# Patient Record
Sex: Male | Born: 1980 | Race: Black or African American | Hispanic: No | Marital: Single | Smoking: Current every day smoker
Health system: Southern US, Community
[De-identification: ages and names within clinical notes are randomized; demographics above are authoritative.]

## PROBLEM LIST (undated history)

## (undated) DIAGNOSIS — R7989 Other specified abnormal findings of blood chemistry: Secondary | ICD-10-CM

## (undated) DIAGNOSIS — G629 Polyneuropathy, unspecified: Secondary | ICD-10-CM

## (undated) DIAGNOSIS — F1010 Alcohol abuse, uncomplicated: Secondary | ICD-10-CM

## (undated) HISTORY — PX: HAND SURGERY: SHX662

## (undated) HISTORY — PX: MANDIBLE SURGERY: SHX707

---

## 2012-07-16 ENCOUNTER — Emergency Department (HOSPITAL_BASED_OUTPATIENT_CLINIC_OR_DEPARTMENT_OTHER): Payer: Self-pay

## 2012-07-16 ENCOUNTER — Emergency Department (HOSPITAL_BASED_OUTPATIENT_CLINIC_OR_DEPARTMENT_OTHER)
Admission: EM | Admit: 2012-07-16 | Discharge: 2012-07-16 | Disposition: A | Discharge status: Home / Self Care | Payer: Self-pay | Attending: Emergency Medicine | Admitting: Emergency Medicine

## 2012-07-16 ENCOUNTER — Encounter (HOSPITAL_BASED_OUTPATIENT_CLINIC_OR_DEPARTMENT_OTHER): Payer: Self-pay | Admitting: Student

## 2012-07-16 DIAGNOSIS — R197 Diarrhea, unspecified: Secondary | ICD-10-CM | POA: Insufficient documentation

## 2012-07-16 DIAGNOSIS — F17200 Nicotine dependence, unspecified, uncomplicated: Secondary | ICD-10-CM | POA: Insufficient documentation

## 2012-07-16 DIAGNOSIS — Z79899 Other long term (current) drug therapy: Secondary | ICD-10-CM | POA: Insufficient documentation

## 2012-07-16 DIAGNOSIS — Z880 Allergy status to penicillin: Secondary | ICD-10-CM | POA: Insufficient documentation

## 2012-07-16 DIAGNOSIS — R1013 Epigastric pain: Secondary | ICD-10-CM | POA: Insufficient documentation

## 2012-07-16 DIAGNOSIS — R112 Nausea with vomiting, unspecified: Secondary | ICD-10-CM | POA: Insufficient documentation

## 2012-07-16 LAB — CBC WITH DIFFERENTIAL/PLATELET
Basophils Relative: 1 % (ref 0–1)
HCT: 44.1 % (ref 39.0–52.0)
Hemoglobin: 15.9 g/dL (ref 13.0–17.0)
Lymphocytes Relative: 46 % (ref 12–46)
Lymphs Abs: 2.1 10*3/uL (ref 0.7–4.0)
MCHC: 36.1 g/dL — ABNORMAL HIGH (ref 30.0–36.0)
Monocytes Absolute: 0.5 10*3/uL (ref 0.1–1.0)
Monocytes Relative: 10 % (ref 3–12)
Neutro Abs: 1.6 10*3/uL — ABNORMAL LOW (ref 1.7–7.7)
Neutrophils Relative %: 35 % — ABNORMAL LOW (ref 43–77)
RBC: 4.9 MIL/uL (ref 4.22–5.81)

## 2012-07-16 LAB — URINALYSIS, ROUTINE W REFLEX MICROSCOPIC
Hgb urine dipstick: NEGATIVE
Ketones, ur: 40 mg/dL — AB
Nitrite: NEGATIVE
Specific Gravity, Urine: 1.023 (ref 1.005–1.030)
pH: 6 (ref 5.0–8.0)

## 2012-07-16 LAB — COMPREHENSIVE METABOLIC PANEL
Albumin: 5 g/dL (ref 3.5–5.2)
Alkaline Phosphatase: 74 U/L (ref 39–117)
BUN: 13 mg/dL (ref 6–23)
CO2: 24 mEq/L (ref 19–32)
Chloride: 97 mEq/L (ref 96–112)
Creatinine, Ser: 0.8 mg/dL (ref 0.50–1.35)
GFR calc non Af Amer: >90 mL/min (ref >90)
Glucose, Bld: 90 mg/dL (ref 70–99)
Potassium: 4.6 mEq/L (ref 3.5–5.1)
Total Bilirubin: 1 mg/dL (ref 0.3–1.2)

## 2012-07-16 LAB — URINE MICROSCOPIC-ADD ON

## 2012-07-16 LAB — LIPASE, BLOOD: Lipase: 32 U/L (ref 11–59)

## 2012-07-16 MED ORDER — OMEPRAZOLE 20 MG PO CPDR: 20.0000 mg | DELAYED_RELEASE_CAPSULE | Freq: Every day | ORAL | Status: DC

## 2012-07-16 MED ORDER — MORPHINE SULFATE 4 MG/ML IJ SOLN
4.0000 mg | Freq: Once | INTRAMUSCULAR | Status: AC
  Administered 2012-07-16: 4 mg via INTRAVENOUS
  Filled 2012-07-16: qty 1

## 2012-07-16 MED ORDER — ONDANSETRON HCL 4 MG/2ML IJ SOLN
4.0000 mg | Freq: Once | INTRAMUSCULAR | Status: AC
  Administered 2012-07-16: 4 mg via INTRAVENOUS
  Filled 2012-07-16: qty 2

## 2012-07-16 MED ORDER — IOHEXOL 300 MG/ML  SOLN
50.0000 mL | Freq: Once | INTRAMUSCULAR | Status: AC | PRN
  Administered 2012-07-16: 50 mL via ORAL

## 2012-07-16 MED ORDER — SODIUM CHLORIDE 0.9 % IV BOLUS (SEPSIS)
1000.0000 mL | Freq: Once | INTRAVENOUS | Status: AC
  Administered 2012-07-16: 1000 mL via INTRAVENOUS

## 2012-07-16 MED ORDER — SUCRALFATE 1 G PO TABS: 1.0000 g | ORAL_TABLET | Freq: Four times a day (QID) | ORAL | Status: DC

## 2012-07-16 MED ORDER — IOHEXOL 300 MG/ML  SOLN
100.0000 mL | Freq: Once | INTRAMUSCULAR | Status: AC | PRN
  Administered 2012-07-16: 100 mL via INTRAVENOUS

## 2012-07-16 NOTE — ED Notes (Signed)
IV removed and both sites applied gauzes and tape

## 2012-07-16 NOTE — ED Notes (Signed)
Pt in with c/o abdominal pain in middle of stomach x 1 week with associated N after eating.

## 2012-07-16 NOTE — ED Provider Notes (Signed)
History     CSN: [id number]  Arrival date & time 07/16/12  1100   First MD Initiated Contact with Patient 07/16/12 1221      Chief Complaint  Patient presents with  . Abdominal Pain    (Consider location/radiation/quality/duration/timing/severity/associated sxs/prior treatment) HPI Comments: Patient complains of abdominal pain. He states it's been gone on for a week but has been gradually getting worse. He has a discomfort he says to his midabdomen. He says gets worse after he eats and he has diarrhea and loose stools after he. He's had associated nausea and vomiting. His emesis has been nonbloody and nonbilious. He denies any blood in the stool or melena. He does admit to drinking alcohol he says he drinks at least 3 or 4 beers a night.  Patient is a 32 y.o. male presenting with abdominal pain.  Abdominal Pain Associated symptoms include abdominal pain. Pertinent negatives include no chest pain, no headaches and no shortness of breath.    History reviewed. No pertinent past medical history.  Past Surgical History  Procedure Laterality Date  . Mandible surgery Right   . Hand surgery Right     History reviewed. No pertinent family history.  History  Substance Use Topics  . Smoking status: Current Every Day Smoker  . Smokeless tobacco: Not on file  . Alcohol Use: Yes      Review of Systems  Constitutional: Negative for fever, chills, diaphoresis and fatigue.  HENT: Negative for congestion, rhinorrhea and sneezing.   Eyes: Negative.   Respiratory: Negative for cough, chest tightness and shortness of breath.   Cardiovascular: Negative for chest pain and leg swelling.  Gastrointestinal: Positive for nausea, vomiting, abdominal pain and diarrhea. Negative for blood in stool.  Genitourinary: Negative for frequency, hematuria, flank pain and difficulty urinating.  Musculoskeletal: Negative for back pain and arthralgias.  Skin: Negative for rash.  Neurological: Negative for  dizziness, speech difficulty, weakness, numbness and headaches.    Allergies  Penicillins  Home Medications   Current Outpatient Rx  Name  Route  Sig  Dispense  Refill  . omeprazole (PRILOSEC) 20 MG capsule   Oral   Take 1 capsule (20 mg total) by mouth daily.   30 capsule   0   . sucralfate (CARAFATE) 1 G tablet   Oral   Take 1 tablet (1 g total) by mouth 4 (four) times daily.   30 tablet   0     BP 125/86  Pulse 92  Temp(Src) 99 F (37.2 C) (Oral)  Resp 18  Ht 6\' 1"  (1.854 m)  Wt 160 lb (72.576 kg)  BMI 21.11 kg/m2  SpO2 99%  Physical Exam  Constitutional: He is oriented to person, place, and time. He appears well-developed and well-nourished.  HENT:  Head: Normocephalic and atraumatic.  Eyes: Pupils are equal, round, and reactive to light.  Neck: Normal range of motion. Neck supple.  Cardiovascular: Normal rate, regular rhythm and normal heart sounds.   Pulmonary/Chest: Effort normal and breath sounds normal. No respiratory distress. He has no wheezes. He has no rales. He exhibits no tenderness.  Abdominal: Soft. Bowel sounds are normal. There is tenderness (mild TTP epigastrium, moderate TTP RLQ, no CVA tenderness). There is no rebound and no guarding.  Musculoskeletal: Normal range of motion. He exhibits no edema.  Lymphadenopathy:    He has no cervical adenopathy.  Neurological: He is alert and oriented to person, place, and time.  Skin: Skin is warm and dry. No rash noted.  Psychiatric: He has a normal mood and affect.    ED Course  Procedures (including critical care time)  Results for orders placed during the hospital encounter of 07/16/12  URINALYSIS, ROUTINE W REFLEX MICROSCOPIC      Result Value Range   Color, Urine AMBER (*) YELLOW   APPearance CLEAR  CLEAR   Specific Gravity, Urine 1.023  1.005 - 1.030   pH 6.0  5.0 - 8.0   Glucose, UA NEGATIVE  NEGATIVE mg/dL   Hgb urine dipstick NEGATIVE  NEGATIVE   Bilirubin Urine SMALL (*) NEGATIVE    Ketones, ur 40 (*) NEGATIVE mg/dL   Protein, ur 30 (*) NEGATIVE mg/dL   Urobilinogen, UA 1.0  0.0 - 1.0 mg/dL   Nitrite NEGATIVE  NEGATIVE   Leukocytes, UA NEGATIVE  NEGATIVE  URINE MICROSCOPIC-ADD ON      Result Value Range   Squamous Epithelial / LPF RARE  RARE   RBC / HPF 0-2  <3 RBC/hpf   Urine-Other MUCOUS PRESENT    CBC WITH DIFFERENTIAL      Result Value Range   WBC 4.5  4.0 - 10.5 K/uL   RBC 4.90  4.22 - 5.81 MIL/uL   Hemoglobin 15.9  13.0 - 17.0 g/dL   HCT 19.1  47.8 - 29.5 %   MCV 90.0  78.0 - 100.0 fL   MCH 32.4  26.0 - 34.0 pg   MCHC 36.1 (*) 30.0 - 36.0 g/dL   RDW 62.1  30.8 - 65.7 %   Platelets 167  150 - 400 K/uL   Neutrophils Relative % 35 (*) 43 - 77 %   Neutro Abs 1.6 (*) 1.7 - 7.7 K/uL   Lymphocytes Relative 46  12 - 46 %   Lymphs Abs 2.1  0.7 - 4.0 K/uL   Monocytes Relative 10  3 - 12 %   Monocytes Absolute 0.5  0.1 - 1.0 K/uL   Eosinophils Relative 8 (*) 0 - 5 %   Eosinophils Absolute 0.3  0.0 - 0.7 K/uL   Basophils Relative 1  0 - 1 %   Basophils Absolute 0.0  0.0 - 0.1 K/uL  COMPREHENSIVE METABOLIC PANEL      Result Value Range   Sodium 137  135 - 145 mEq/L   Potassium 4.6  3.5 - 5.1 mEq/L   Chloride 97  96 - 112 mEq/L   CO2 24  19 - 32 mEq/L   Glucose, Bld 90  70 - 99 mg/dL   BUN 13  6 - 23 mg/dL   Creatinine, Ser 8.46  0.50 - 1.35 mg/dL   Calcium 96.2  8.4 - 95.2 mg/dL   Total Protein 8.4 (*) 6.0 - 8.3 g/dL   Albumin 5.0  3.5 - 5.2 g/dL   AST 841 (*) 0 - 37 U/L   ALT 138 (*) 0 - 53 U/L   Alkaline Phosphatase 74  39 - 117 U/L   Total Bilirubin 1.0  0.3 - 1.2 mg/dL   GFR calc non Af Amer >90  >90 mL/min   GFR calc Af Amer >90  >90 mL/min  LIPASE, BLOOD      Result Value Range   Lipase 32  11 - 59 U/L   Ct Abdomen Pelvis W Contrast  07/16/2012   *RADIOLOGY REPORT*  Clinical Data: RLQ pain, nausea, diarrhea  CT ABDOMEN AND PELVIS WITH CONTRAST  Technique:  Multidetector CT imaging of the abdomen and pelvis was performed following the standard  protocol during bolus  administration of intravenous contrast.  Contrast: OMNIPAQUE IOHEXOL 300 MG/ML  SOLN  Comparison: None.  Findings: Lung bases are clear.  Focal fat/altered perfusion along the falciform ileum (series 2/image 27).  7 mm probable flash filling hemangioma in the lateral segment left hepatic lobe (series 2/image 30).  Spleen, pancreas, and adrenal glands are within normal limits.  Gallbladder is unremarkable.  No intrahepatic or extrahepatic ductal dilatation.  Kidneys are within normal limits.  No hydronephrosis.  No evidence of bowel obstruction.  Normal appendix.  Mildly thickened appearance of the cecum/ileocecal valve is likely related to underdistension.  No evidence of abdominal aortic aneurysm.  No abdominopelvic ascites.  No suspicious abdominopelvic lymphadenopathy.  Prostate is unremarkable.  Bladder is within normal limits.  Visualized osseous structures are within normal limits.  IMPRESSION: Normal appendix.  No evidence of bowel obstruction.  No CT findings to account for the patient's right lower quadrant abdominal pain.   Original Report Authenticated By: Charline Bills, M.D.       1. Epigastric pain       MDM  Patient with abdominal pain. He has primarily pain in his epigastrium although on exam he was having some tenderness to his right lower quadrant. There is no evidence of appendicitis. I feel this is likely gastritis. There is no evidence of pancreatitis or gallbladder disease. He is a chronic drinker and I did counsel him that he needs to cut back or stop his drinking. I did notify him that his liver enzymes are elevated which is likely related to the alcohol use. I will give him for surgery for Prilosec and Carafate. I advised him to followup with gastroenterology. Advised to return here if his symptoms worsen.        Rolan Bucco, MD 07/16/12 [phone]

## 2012-07-18 ENCOUNTER — Telehealth (HOSPITAL_BASED_OUTPATIENT_CLINIC_OR_DEPARTMENT_OTHER): Payer: Self-pay | Admitting: Emergency Medicine

## 2012-07-18 MED ORDER — OMEPRAZOLE 20 MG PO CPDR: 20.0000 mg | DELAYED_RELEASE_CAPSULE | Freq: Every day | ORAL | Status: DC

## 2012-07-18 MED ORDER — SUCRALFATE 1 G PO TABS: 1.0000 g | ORAL_TABLET | Freq: Four times a day (QID) | ORAL | Status: DC

## 2012-07-18 NOTE — ED Notes (Signed)
Pt called stating that he never received his prescriptions.  Pt's prescriptions must be in the [facility].  These medications have been electronically sent to the [facility] on [address] in [facility].

## 2014-02-18 ENCOUNTER — Encounter (HOSPITAL_BASED_OUTPATIENT_CLINIC_OR_DEPARTMENT_OTHER): Payer: Self-pay

## 2014-02-18 ENCOUNTER — Emergency Department (HOSPITAL_BASED_OUTPATIENT_CLINIC_OR_DEPARTMENT_OTHER)
Admission: EM | Admit: 2014-02-18 | Discharge: 2014-02-18 | Disposition: A | Discharge status: Home / Self Care | Payer: Self-pay | Attending: Emergency Medicine | Admitting: Emergency Medicine

## 2014-02-18 DIAGNOSIS — M545 Low back pain, unspecified: Secondary | ICD-10-CM

## 2014-02-18 DIAGNOSIS — Z880 Allergy status to penicillin: Secondary | ICD-10-CM | POA: Insufficient documentation

## 2014-02-18 DIAGNOSIS — Z720 Tobacco use: Secondary | ICD-10-CM | POA: Insufficient documentation

## 2014-02-18 MED ORDER — TRAMADOL HCL 50 MG PO TABS: 50.0000 mg | ORAL_TABLET | Freq: Four times a day (QID) | ORAL | Status: DC | PRN

## 2014-02-18 MED ORDER — NAPROXEN 500 MG PO TABS: 500.0000 mg | ORAL_TABLET | Freq: Two times a day (BID) | ORAL | Status: DC

## 2014-02-18 NOTE — Discharge Instructions (Signed)

## 2014-02-18 NOTE — ED Notes (Signed)
Patient here with lower back pain x 2 days, denies any known injury. Reports pain with any movment

## 2014-02-18 NOTE — ED Provider Notes (Signed)
CSN: [id number]     Arrival date & time 02/18/14  0754 History   First MD Initiated Contact with Patient 02/18/14 [phone]     Chief Complaint  Patient presents with  . Back Pain     (Consider location/radiation/quality/duration/timing/severity/associated sxs/prior Treatment) HPI Comments: Patient presents with low back pain. He describes an achy pain to his low back for the last 2 days. It's been worsening for last 2 days. It's worse with movement or standing. He denies any past history of back problems. 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 He denies any radiation of the pain. He denies any numbness or weakness to his legs. He denies any urinary symptoms. He denies any loss of bowel or bladder control. He denies any history of cancer. He's been taking ibuprofen without relief.  Patient is a 34 y.o. Luis [name] presenting with back pain.  Back Pain Associated symptoms: no abdominal pain, no dysuria, no fever, no headaches, no numbness and no weakness     History reviewed. No pertinent past medical history. Past Surgical History  Procedure Laterality Date  . Mandible surgery Right   . Hand surgery Right    No family history on file. History  Substance Use Topics  . Smoking status: Current Every Day Smoker  . Smokeless tobacco: Not on file  . Alcohol Use: Yes    Review of Systems  Constitutional: Negative for fever.  Gastrointestinal: Negative for nausea, vomiting and abdominal pain.  Genitourinary: Negative.  Negative for dysuria, frequency, hematuria, decreased urine volume and testicular pain.  Musculoskeletal: Positive for back pain. Negative for joint swelling, arthralgias and neck pain.  Skin: Negative for wound.  Neurological: Negative for weakness, numbness and headaches.      Allergies  Penicillins  Home Medications   Prior to Admission medications   Medication Sig Start Date End Date Taking? Authorizing Provider  naproxen (NAPROSYN) 500 MG  tablet Take 1 tablet (500 mg total) by mouth 2 (two) times daily. 02/18/14   Rolan Bucco, MD  traMADol (ULTRAM) 50 MG tablet Take 1 tablet (50 mg total) by mouth every 6 (six) hours as needed. 02/18/14   Rolan Bucco, MD   BP 139/93 mmHg  Pulse 101  Temp(Src) 98.3 F (36.8 C) (Oral)  Resp 14  Ht  (1.854 m)  Wt 164 lb (74.39 kg)  BMI 21.64 kg/m2  SpO2 100% Physical Exam  Constitutional: He is oriented to person, place, and time. He appears well-developed and well-nourished.  Cardiovascular: Normal rate.   HR 92 on my exam  Pulmonary/Chest: Effort normal.  Musculoskeletal:  Positive tenderness along the lower lumbar spine. No step-offs or deformities. There some tenderness along the paraspinal muscles bilaterally. Negative straight leg raise bilaterally. Patellar reflexes symmetric bilaterally.  Neurological: He is alert and oriented to person, place, and time.  Patient has normal motor function in the legs bilaterally. He has normal sensation in the legs bilaterally. Gait is normal. Pedal pulses intact    ED Course  Procedures (including critical care time) Labs Review Labs Reviewed - No data to display  Imaging Review No results found.   EKG Interpretation None      MDM   Final diagnoses:  Midline low back pain without sciatica    Patient presents with low back pain. He is neurologically intact. There is no evidence of cauda equina. He has no other red flags which would warrant imaging studies today. He was given prescription for Ultram  and Naprosyn. He was given referral to follow-up with Dr. Pearletha ForgeHudnall if his symptoms are not improving.    Rolan BuccoMelanie Jahni Nazar, MD 02/18/14 [phone]

## 2014-04-26 ENCOUNTER — Encounter (HOSPITAL_BASED_OUTPATIENT_CLINIC_OR_DEPARTMENT_OTHER): Payer: Self-pay

## 2014-04-26 ENCOUNTER — Emergency Department (HOSPITAL_BASED_OUTPATIENT_CLINIC_OR_DEPARTMENT_OTHER): Payer: Self-pay

## 2014-04-26 ENCOUNTER — Emergency Department (HOSPITAL_BASED_OUTPATIENT_CLINIC_OR_DEPARTMENT_OTHER)
Admission: EM | Admit: 2014-04-26 | Discharge: 2014-04-26 | Disposition: A | Discharge status: Home / Self Care | Payer: Self-pay | Attending: Emergency Medicine | Admitting: Emergency Medicine

## 2014-04-26 DIAGNOSIS — Z720 Tobacco use: Secondary | ICD-10-CM | POA: Insufficient documentation

## 2014-04-26 DIAGNOSIS — Z880 Allergy status to penicillin: Secondary | ICD-10-CM | POA: Insufficient documentation

## 2014-04-26 DIAGNOSIS — Z791 Long term (current) use of non-steroidal anti-inflammatories (NSAID): Secondary | ICD-10-CM | POA: Insufficient documentation

## 2014-04-26 DIAGNOSIS — R079 Chest pain, unspecified: Secondary | ICD-10-CM | POA: Insufficient documentation

## 2014-04-26 LAB — URINALYSIS, ROUTINE W REFLEX MICROSCOPIC
Bilirubin Urine: NEGATIVE
GLUCOSE, UA: NEGATIVE mg/dL
HGB URINE DIPSTICK: NEGATIVE
KETONES UR: NEGATIVE mg/dL
Leukocytes, UA: NEGATIVE
Nitrite: NEGATIVE
PROTEIN: NEGATIVE mg/dL
Specific Gravity, Urine: 1.021 (ref 1.005–1.030)
Urobilinogen, UA: 1 mg/dL (ref 0.0–1.0)
pH: 7 (ref 5.0–8.0)

## 2014-04-26 LAB — CBC WITH DIFFERENTIAL/PLATELET
BAND NEUTROPHILS: 0 % (ref 0–10)
BLASTS: 0 %
Basophils Absolute: 0 10*3/uL (ref 0.0–0.1)
Basophils Relative: 0 % (ref 0–1)
Eosinophils Absolute: 0.4 10*3/uL (ref 0.0–0.7)
Eosinophils Relative: 9 % — ABNORMAL HIGH (ref 0–5)
HCT: 39.6 % (ref 39.0–52.0)
Hemoglobin: 14 g/dL (ref 13.0–17.0)
LYMPHS ABS: 2.2 10*3/uL (ref 0.7–4.0)
LYMPHS PCT: 57 % — AB (ref 12–46)
MCH: 32.3 pg (ref 26.0–34.0)
MCHC: 35.4 g/dL (ref 30.0–36.0)
MCV: 91.5 fL (ref 78.0–100.0)
MONO ABS: 0.4 10*3/uL (ref 0.1–1.0)
Metamyelocytes Relative: 0 %
Monocytes Relative: 11 % (ref 3–12)
Myelocytes: 0 %
NEUTROS ABS: 0.9 10*3/uL — AB (ref 1.7–7.7)
NEUTROS PCT: 23 % — AB (ref 43–77)
PLATELETS: 177 10*3/uL (ref 150–400)
PROMYELOCYTES ABS: 0 %
RBC: 4.33 MIL/uL (ref 4.22–5.81)
RDW: 12 % (ref 11.5–15.5)
WBC: 3.9 10*3/uL — AB (ref 4.0–10.5)
nRBC: 0 /100 WBC

## 2014-04-26 LAB — BASIC METABOLIC PANEL
ANION GAP: 9 (ref 5–15)
BUN: 12 mg/dL (ref 6–23)
CHLORIDE: 102 mmol/L (ref 96–112)
CO2: 27 mmol/L (ref 19–32)
CREATININE: 0.84 mg/dL (ref 0.50–1.35)
Calcium: 9.3 mg/dL (ref 8.4–10.5)
GFR calc Af Amer: >90 mL/min (ref >90)
GFR calc non Af Amer: >90 mL/min (ref >90)
Glucose, Bld: 104 mg/dL — ABNORMAL HIGH (ref 70–99)
Potassium: 4 mmol/L (ref 3.5–5.1)
Sodium: 138 mmol/L (ref 135–145)

## 2014-04-26 LAB — D-DIMER, QUANTITATIVE: D-Dimer, Quant: <0.27 ug/mL-FEU (ref 0.00–0.48)

## 2014-04-26 LAB — TROPONIN I: Troponin I: <0.03 ng/mL (ref <0.031)

## 2014-04-26 MED ORDER — HYDROCODONE-ACETAMINOPHEN 5-325 MG PO TABS: 1.0000 | ORAL_TABLET | ORAL | Status: DC | PRN

## 2014-04-26 MED ORDER — IBUPROFEN 800 MG PO TABS: 800.0000 mg | ORAL_TABLET | Freq: Three times a day (TID) | ORAL | Status: DC | PRN

## 2014-04-26 MED ORDER — KETOROLAC TROMETHAMINE 30 MG/ML IJ SOLN
30.0000 mg | Freq: Once | INTRAMUSCULAR | Status: AC
  Administered 2014-04-26: 30 mg via INTRAVENOUS
  Filled 2014-04-26: qty 1

## 2014-04-26 NOTE — ED Provider Notes (Signed)
TIME SEEN: 11:30 AM  CHIEF COMPLAINT: Chest pain, shortness of breath  HPI: Pt is a 34 y.o. male with history of tobacco use, prior spontaneous pneumothorax in January 2015 at [facility] that did not require a chest tube presents emergency department with intermittent sharp chest pain that started on the left side but is now diffusely across his chest. He has had some associated shortness of breath because it hurts to take a deep breath and dry cough. No fever or chills. No dizziness, diaphoresis, nausea or vomiting. No history of hypertension, diabetes, hyperlipidemia. No prior history of pulmonary embolus or DVT.  No recent surgery, fracture, trauma. No family history of premature CAD.  ROS: See HPI Constitutional: no fever  Eyes: no drainage  ENT: no runny nose   Cardiovascular:   chest pain  Resp:  SOB  GI: no vomiting GU: no dysuria Integumentary: no rash  Allergy: no hives  Musculoskeletal: no leg swelling  Neurological: no slurred speech ROS otherwise negative  PAST MEDICAL HISTORY/PAST SURGICAL HISTORY:  No past medical history on file.  MEDICATIONS:  Prior to Admission medications   Medication Sig Start Date End Date Taking? Authorizing Provider  naproxen (NAPROSYN) 500 MG tablet Take 1 tablet (500 mg total) by mouth 2 (two) times daily. 02/18/14   Rolan Bucco, MD  traMADol (ULTRAM) 50 MG tablet Take 1 tablet (50 mg total) by mouth every 6 (six) hours as needed. 02/18/14   Rolan Bucco, MD    ALLERGIES:  Allergies  Allergen Reactions  . Penicillins     SOCIAL HISTORY:  History  Substance Use Topics  . Smoking status: Current Every Day Smoker -- 0.50 packs/day for 15 years    Types: Cigarettes  . Smokeless tobacco: Never Used  . Alcohol Use: Yes    FAMILY HISTORY: No family history on file.  EXAM: BP 138/94 mmHg  Pulse 84  Temp(Src) 98.1 F (36.7 C)  Resp 18  Ht  (1.854 m)  Wt 164 lb (74.39 kg)  BMI 21.64 kg/m2  SpO2  100% CONSTITUTIONAL: Alert and oriented and responds appropriately to questions. Well-appearing; well-nourished HEAD: Normocephalic EYES: Conjunctivae clear, PERRL ENT: normal nose; no rhinorrhea; moist mucous membranes; pharynx without lesions noted NECK: Supple, no meningismus, no LAD  CARD: RRR; S1 and S2 appreciated; no murmurs, no clicks, no rubs, no gallops RESP: Normal chest excursion without splinting or tachypnea; breath sounds clear and equal bilaterally; no wheezes, no rhonchi, no rales, chest wall is mildly tender to palpation without crepitus or ecchymosis or deformity, good aeration diffusely, no respiratory distress or hypoxia, speaking full sentences ABD/GI: Normal bowel sounds; non-distended; soft, non-tender, no rebound, no guarding BACK:  The back appears normal and is non-tender to palpation, there is no CVA tenderness EXT: Normal ROM in all joints; non-tender to palpation; no edema; normal capillary refill; no cyanosis; no calf tenderness or swelling    SKIN: Normal color for age and race; warm NEURO: Moves all extremities equally PSYCH: The patient's mood and manner are appropriate. Grooming and personal hygiene are appropriate.  MEDICAL DECISION MAKING: Patient here with left-sided chest pain and that has moved also into the right side with shortness of breath. No risk factors for ACS other than tobacco use. Reports a previous history of pneumothorax that did not require a chest tube only oxygen and admission to the ICU.  Given he was hospitalized in January he has risk for pulmonary embolus. We'll obtain labs including troponin, d-dimer, chest x-ray. We'll  give Toradol for pain.  ED PROGRESS: Patient reports feeling much better. Labs unremarkable including negative troponin and negative d-dimer. 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 Patient reports that he has followed up with a pulmonologist  for this and they think this may have been secondary to chronic exposure at work. I feel some of his pain today may be chest wall pain. Only risk factor for heart disease is tobacco use. No new pneumothorax seen on chest x-ray. We'll discharge her with prescription for ibuprofen, Vicodin. We'll have him follow-up with primary care physician and his pulmonologist. Discussed return precautions. He verbalizes understanding and is comfortable with plan.       EKG Interpretation  Date/Time:  Wednesday April 26 2014 10:52:08 EDT Ventricular Rate:  86 PR Interval:  164 QRS Duration: 92 QT Interval:  378 QTC Calculation: 452 R Axis:   70 Text Interpretation:  Normal sinus rhythm Early repolarization Normal ECG No old tracing to compare Confirmed by Sharayah Renfrow,  DO, Byrant Valent [phone]([id number]) on 04/26/2014 10:55:32 AM        Layla MawKristen N Taevin Mcferran, DO 04/26/14 1344

## 2014-04-26 NOTE — Discharge Instructions (Signed)
Chest Wall Pain °Chest wall pain is pain in or around the bones and muscles of your chest. It may take up to 6 weeks to get better. It may take longer if you must stay physically active in your work and activities.  °CAUSES  °Chest wall pain may happen on its own. However, it may be caused by: °· A viral illness like the flu. °· Injury. °· Coughing. °· Exercise. °· Arthritis. °· Fibromyalgia. °· Shingles. °HOME CARE INSTRUCTIONS  °· Avoid overtiring physical activity. Try not to strain or perform activities that cause pain. This includes any activities using your chest or your abdominal and side muscles, especially if heavy weights are used. °· Put ice on the sore area. °¨ Put ice in a plastic bag. °¨ Place a towel between your skin and the bag. °¨ Leave the ice on for 15-20 minutes per hour while awake for the first 2 days. °· Only take over-the-counter or prescription medicines for pain, discomfort, or fever as directed by your caregiver. °SEEK IMMEDIATE MEDICAL CARE IF:  °· Your pain increases, or you are very uncomfortable. °· You have a fever. °· Your chest pain becomes worse. °· You have new, unexplained symptoms. °· You have nausea or vomiting. °· You feel sweaty or lightheaded. °· You have a cough with phlegm (sputum), or you cough up blood. °MAKE SURE YOU:  °· Understand these instructions. °· Will watch your condition. °· Will get help right away if you are not doing well or get worse. °Document Released: 01/27/2005 Document Revised: 04/21/2011 Document Reviewed: 09/23/2010 °ExitCare® Patient Information ©2015 [employer]. This information is not intended to replace advice given to you by your health care provider. Make sure you discuss any questions you have with your health care provider. ° ° °Emergency Department Resource Guide °1) Find a Doctor and Pay Out of Pocket °Although you won't have to find out who is covered by your insurance plan, it is a good idea to ask around and get recommendations. You  will then need to call the office and see if the doctor you have chosen will accept you as a new patient and what types of options they offer for patients who are self-pay. Some doctors offer discounts or will set up payment plans for their patients who do not have insurance, but you will need to ask so you aren't surprised when you get to your appointment. ° °2) Contact Your Local Health Department °Not all health departments have doctors that can see patients for sick visits, but many do, so it is worth a call to see if yours does. If you don't know where your local health department is, you can check in your phone book. The CDC also has a tool to help you locate your state's health department, and many state websites also have listings of all of their local health departments. ° °3) Find a Walk-in Clinic °If your illness is not likely to be very severe or complicated, you may want to try a walk in clinic. These are popping up all over the country in pharmacies, drugstores, and shopping centers. They're usually staffed by nurse practitioners or physician assistants that have been trained to treat common illnesses and complaints. They're usually fairly quick and inexpensive. However, if you have serious medical issues or chronic medical problems, these are probably not your best option. ° °No Primary Care Doctor: °- Call [facility] at  832-8000 - they can help you locate a primary care doctor that    accepts your insurance, provides certain services, etc. °- Physician Referral Service- 1-800-533-3463 ° °Chronic Pain Problems: °Organization         Address  Phone   Notes  ° [facility]  (336) 297-2271 Patients need to be referred by their primary care doctor.  ° °Medication Assistance: °Organization         Address  Phone   Notes  °[place] [facility] [address] °[place], [place] [address] (336) 641-8030 --Must be a resident of [place] °-- Must  have NO insurance coverage whatsoever (no Medicaid/ Medicare, etc.) °-- The pt. MUST have a primary care doctor that directs their care regularly and follows them in the community °  °[facility]  (866) 331-1348   °[employer]  (888) 892-1162   ° °Agencies that provide inexpensive medical care: °Organization         Address  Phone   Notes  °[facility]  (336) 832-8035   °[facility]    (336) 832-7272   °[facility] [address] °[place], [place] [address] (336) 832-4777   °[facility] [address], °[place] (336) 271-4999   °[facility]    (336) 373-0678   °[facility]    (336) 272-1050   °[facility] ° [address], [place] Phone:  (336) 832-4444, Fax:  (336) 832-4440 Hours of Operation:  9 am - 6 pm, M-F.  Also accepts Medicaid/Medicare and self-pay.  °[facility] ° [address], [place] Phone: (336) 832-3150, Fax: (336) 832-3151. Hours of Operation:  8:30 am - 5:30 pm, M-F.  Also accepts Medicaid and self-pay.  °[facility] [place] [address], [place] Phone: (336) 878-6027   °[facility] [address], [place], [place] (336)723-1848, Ext. 123 Mondays & Thursdays: 7-9 AM.  First 15 patients are seen on a first come, first serve basis. °  ° °Medicaid-accepting [place] Providers: ° °Organization         Address  Phone   Notes  °[facility] [address], [place] (336) 641-2100 Also accepts self-pay patients.  °[facility] [address], [place] ° (336) 856-9996   °[facility] [address], [place] (336) 288-8857   °[facility] [address] [place] [address], [place] (336) 299-7000   °Veita Bland [address], [place]  ° (336) 373-1557 Only accepts [place] Access Medicaid patients after  they have their name applied to their card.  ° °Self-Pay (no insurance) in [place]: ° °Organization         Address  Phone   Notes  °Sickle Cell Patients, [facility] [address], [place] (336) 832-1970   °[facility] [address], [place] (336) 832-4400   °[facility] ° [address], [place] (336) 992-4800   °[facility]/Dr. Osei-Bonsu ° [address] [place] [address], [place] or [address], [place] (336) [phone] Phone number for both [place] and [place] locations is the same.  °[facility] [address], [place] (336) 299-0000   °[facility] [address] [place] [address],  or [address] (336) 852-7530 °(336) 878-2260   °[facility] [address], [place] (336) 350-1642, phone; (336) 294-5005, fax Sees patients 1st and 3rd Saturday of every month.  Must not qualify for public or private insurance (i.e. Medicaid, Medicare, [place] Health Choice, Veterans' Benefits) • Household income should be no more than 200% of the poverty level •The clinic cannot treat you if you are pregnant or think you are pregnant • Sexually transmitted diseases are not treated at the clinic.  ° ° °Dental Care: °Organization         Address  Phone  Notes  °[facility] [address], [place] (336) 641-6152 Accepts children up to age 21 who are enrolled in Medicaid or [place] Health Choice; pregnant women with a Medicaid card; and children who have applied for Medicaid or [place] Health Choice, but were declined, whose parents can pay a reduced fee at time of service.  °[facility] [place]  [address], [place] (336) 641-7733 Accepts children up to age 21 who are enrolled in Medicaid or [place] Health Choice; pregnant women with a Medicaid card; and children who  have applied for Medicaid or [place] Health Choice, but were declined, whose parents can pay a reduced fee at time of service.  °[facility] ° [address], [place] (336) 641-4533 Patients are seen by appointment only. Walk-ins are not accepted. [facility] will see patients 18 years of age and older. °Monday - Tuesday (8am-5pm) °Most Wednesdays (8:30-5pm) °$30 per visit, cash only  °[facility] ° [address], [place] (336) 641-4533 Patients are seen by appointment only. Walk-ins are not accepted. [facility] will see patients 18 years of age and older. °One Wednesday Evening (Monthly: Volunteer Based).  $30 per visit, cash only  °[facility]  (919) 537-3737 for adults; Children under age 4, call Graduate Pediatric Dentistry at ([phone]) [phone]. Children aged 4-14, please call (919) [phone] to request a pediatric application. ° Dental services are provided in all areas of dental care including fillings, crowns and bridges, complete and partial dentures, implants, gum treatment, root canals, and extractions. Preventive care is also provided. Treatment is provided to both adults and children. °Patients are selected via a lottery and there is often a waiting list. °  °[facility] [address], °[place] ° (336) 763-8833 [email or web address] °  °[facility] [address], [place], [place] (336)723-1848, Ext. 123 Second and Fourth Thursday of each month, opens at 6:30 AM; Clinic ends at 9 AM.  Patients are seen on a first-come first-served basis, and a limited number are seen during each clinic.  ° °[facility] ° [address], [place], [place] (336) 723-7904   Eligibility Requirements °You must have lived in [place], [place], or [place] counties for at least the last three months. °  You cannot be eligible for state or federal sponsored healthcare insurance, including Veterans  Administration, Medicaid, or Medicare. °  You generally cannot be eligible for healthcare insurance through your employer.  °  How to apply: °Eligibility screenings are held every Tuesday and Wednesday afternoon from 1:00 pm until 4:00 pm. You do not need an appointment for the interview!  °[facility] [address], [place],  336-631-2330   °[facility]  336-342-8273   °[facility]  336-703-3100   °[facility]    336-570-6415   ° °Behavioral Health Resources in the Community: °Intensive Outpatient Programs °Organization         Address  Phone  Notes  °[facility] [address], [place], [place] 336-878-6098   °[facility] [address], [place], [place] 336-832-9800   °[facility] [address], [place], [place] ° 336-882-2125   °[facility] [address],  °[place], [place] 1-800-853-5163 or 336-641-4981   °Substance Abuse Resources °Organization         Address  Phone  Notes  °[facility]  336-882-2125   °[facility]  336-784-9470   °[facility]  336-285-9073   °[facility]  336-845-3988   °Residential & Outpatient Substance Abuse Program  1-800-659-3381   °Psychological Services °Organization         Address  Phone  Notes  °[facility]  336- 832-9600   °[facility]  336- 378-7881   °[facility] [address], [place] 1-800-853-5163 or 336-641-4981   ° °Mobile Crisis Teams °Organization         Address  Phone  Notes  °[facility], Mobile Crisis Care Unit  1-877-626-1772   °Assertive °[facility] ° [address] [place], [place] 336-834-9664   °Sharon DeEsch [address] °[place] 336-554-5454   ° °Self-Help/Support Groups °Organization         Address  Phone             Notes  °[facility] [place] -  variety of support groups  336- 373-1402 Call for more information  °Narcotics Anonymous (NA), [facility] [address], °[place]  2 meetings at this location  ° °Residential Treatment Programs °Organization         Address  Phone  Notes  °[facility] [address],    °[place]  1-866-801-8205   °[facility] ° [address], [place], [place] 704-293-8524   °[facility] [address], [place] 336-845-3988 Admissions: 8am-3pm M-F  °[facility] [address]    °[place], [place] 336-841-1104   °[facility] [address], [place], [place] 336-379-7146   °[facility] [address]  °[place], [place] 336-285-9073   °[facility] Intensive Outpatient [address], [place], [place] 336-852-3033   °[facility] [address]  °[place], [place] 1-877-615-2722 or 336-784-9470   °[facility] [address] [place], [place] 336-227-7417 Accepts Medicaid  °[facility] [address]  ° [place] 1-800-659-3381 Substance Abuse/Addiction Treatment  ° °[place] Behavioral Health Resources °Organization         Address  Phone  Notes  °[facility]  (888) 581-9988   °Julie Brannon, PhD [address] [place], [place]   (336) 349-5553 or (336) 951-0000   °[facility]   [address] °[place], [place] (336) 349-4454   °[facility] [address], [place], [place] (336) 342-8316 Insurance/Medicaid/sponsorship through [facility]  °[facility] [address]                                    [place],  (336) 342-8316 Therapy/tele-psych/case  °[facility] [address]  ° [place], [place] (336) 349-2233    °Dr. Arfeen  (336) 349-4544   °[facility] [place]  [employer] [place] [facility] 1) [address], [place] °2) [address], [place] °3)  [address], [place] (336) 349-3220 °(336) 342-7768 ° °(336) 342-8140   °[place] [facility] (336) 342-1394 or (336) 342-3537 (After Hours)    ° ° ° °

## 2014-04-26 NOTE — ED Notes (Signed)
Pt ambulated to room, reports pain 8 in Chest.  Onset 1 week ago on L side, now reports both sides hurt. . Worsens c ambulation and sleep.  experiensing SOB and dizziness c ambulation

## 2014-04-28 LAB — PATHOLOGIST SMEAR REVIEW: PATH REVIEW: BORDERLINE

## 2014-09-20 IMAGING — CT CT ABD-PELV W/ CM
2 of 3 series · 16 of 46 positions shown, 18 images · IV contrast (APPLIED)
Comparison: None.

CLINICAL DATA: RLQ pain, nausea, diarrhea

CT ABDOMEN AND PELVIS WITH CONTRAST
TECHNIQUE: Multidetector CT imaging of the abdomen and pelvis was
performed following the standard protocol during bolus
administration of intravenous contrast.
Contrast: 100mL OMNIPAQUE IOHEXOL 300 MG/ML  SOLN

[Series 2: abd/pelvis 5.0 b31f · axial · 0.60mm/px · z∈[+966,+1371]mm · 13 of 93 slices shown, 15 images]
[im 6/93  soft-tissue]
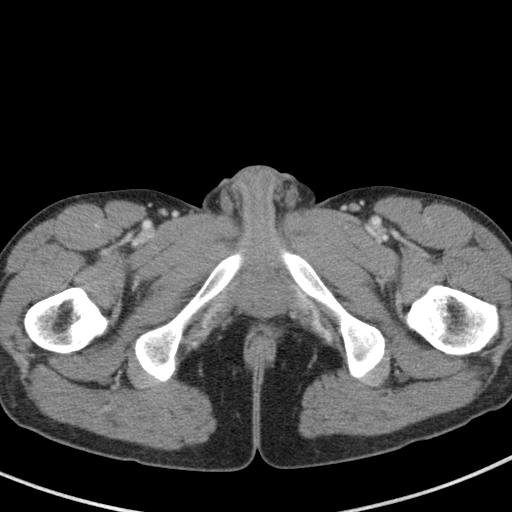
[im 6/93  bone]
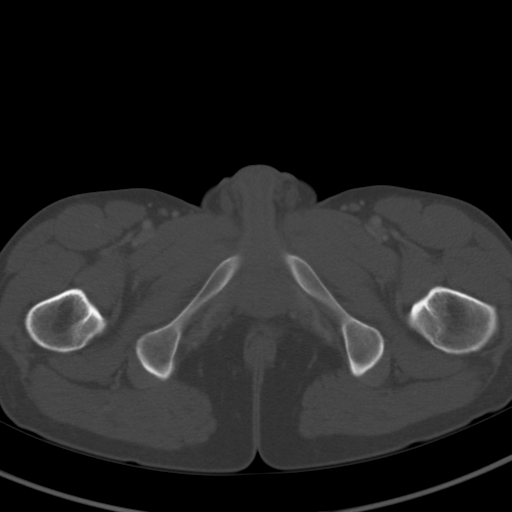
[im 12/93  soft-tissue]
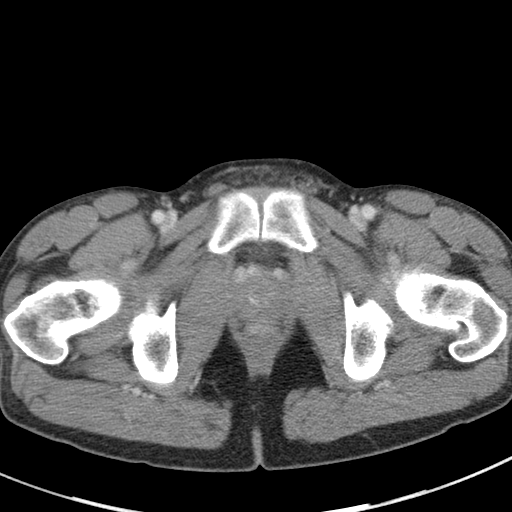
[im 18/93  soft-tissue]
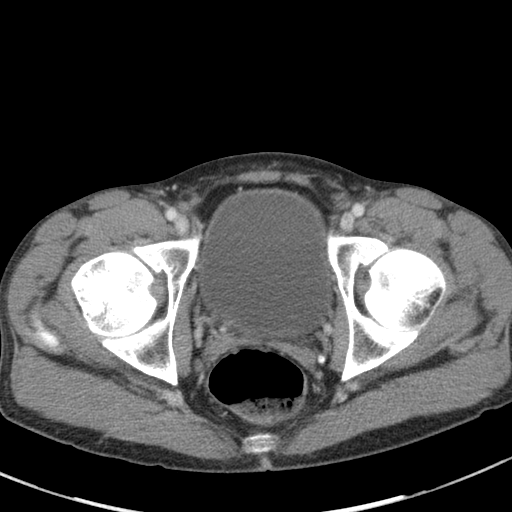
[im 27/93  soft-tissue]
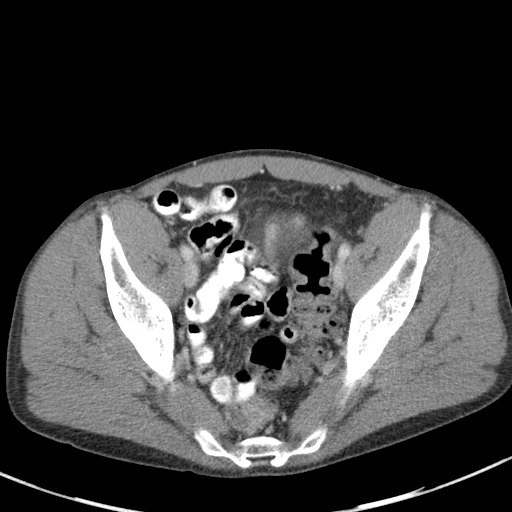
[im 33/93  soft-tissue]
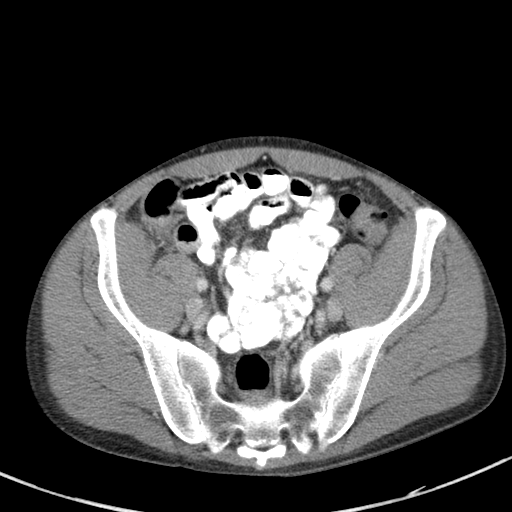
[im 39/93  soft-tissue]
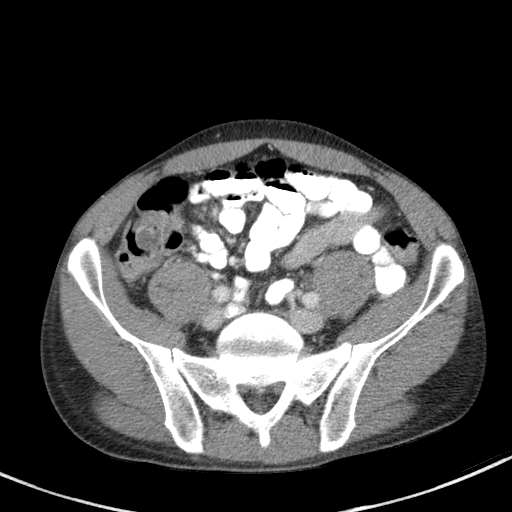
[im 48/93  soft-tissue]
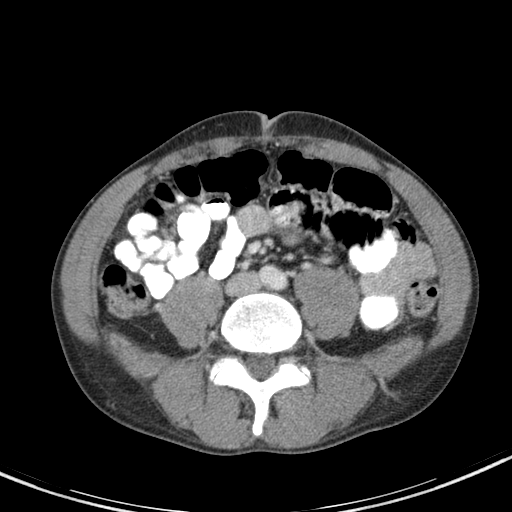
[im 54/93  soft-tissue]
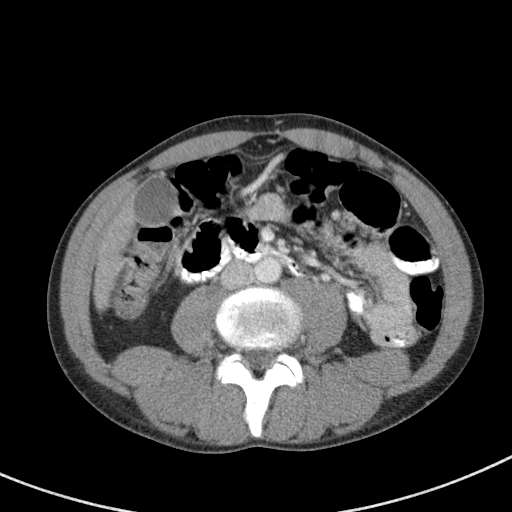
[im 60/93  soft-tissue]
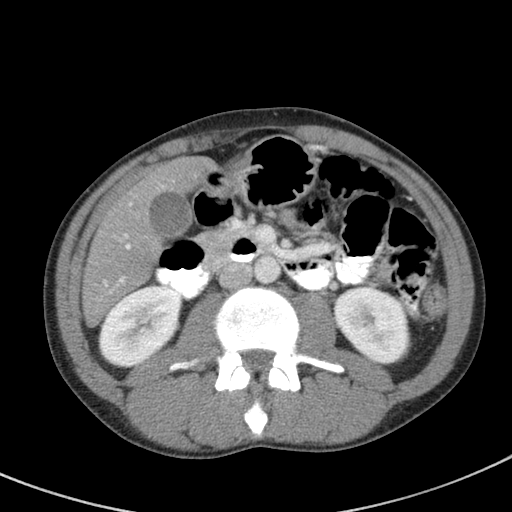
[im 60/93  bone]
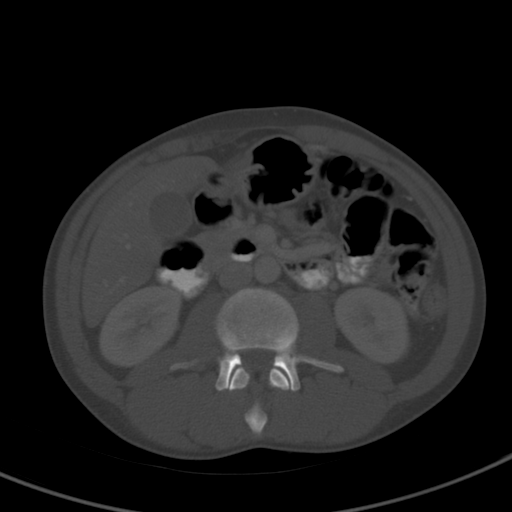
[im 66/93  soft-tissue]
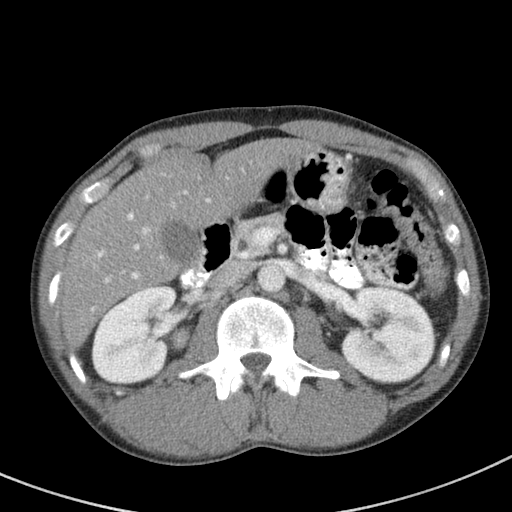
[im 75/93  soft-tissue]
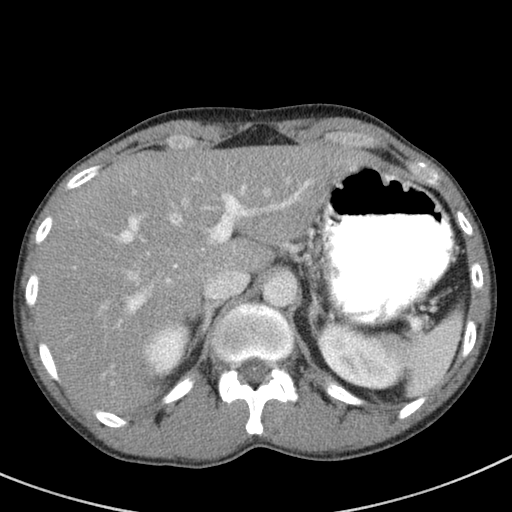
[im 81/93  soft-tissue]
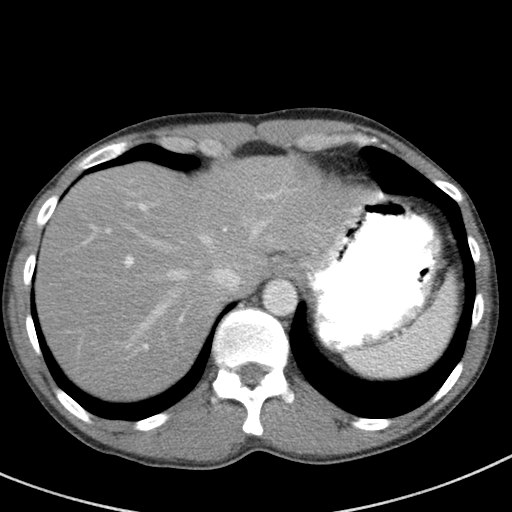
[im 87/93  soft-tissue]
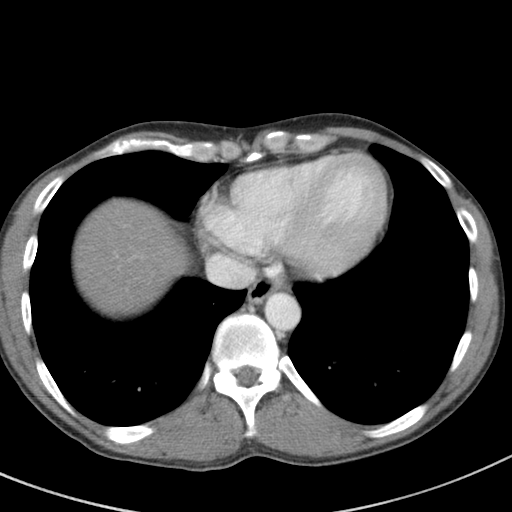

[Series 5: abd/pelvis 3.0 coronal · coronal · 0.69mm/px · 3 of 73 slices shown]
[im 25/73  soft-tissue]
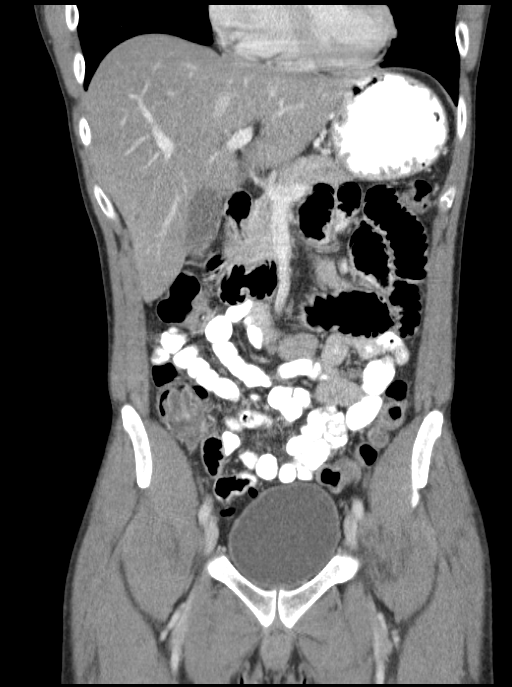
[im 33/73  soft-tissue]
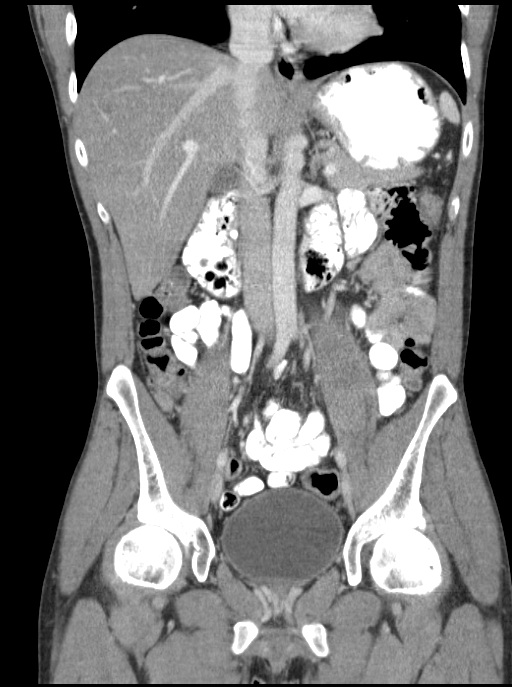
[im 41/73  soft-tissue]
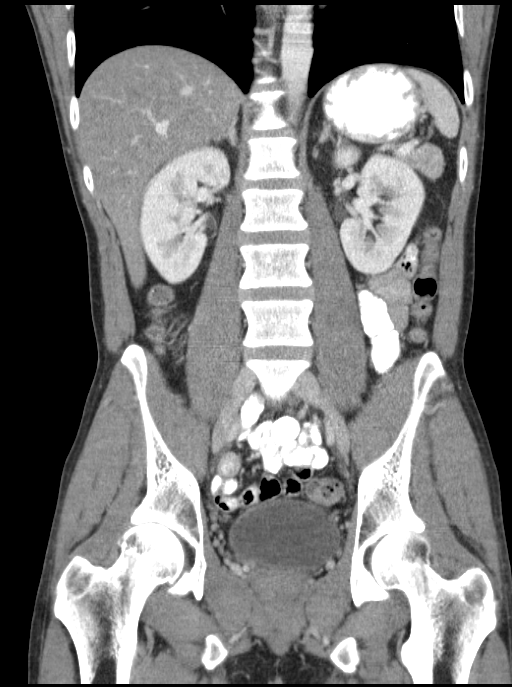

[16 of 46 positions shown; findings below may reference images not displayed]

FINDINGS: Lung bases are clear.

Focal fat/altered perfusion along the falciform ileum (series
2/image 27).  7 mm probable flash filling hemangioma in the lateral
segment left hepatic lobe (series 2/image 30).

Spleen, pancreas, and adrenal glands are within normal limits.

Gallbladder is unremarkable.  No intrahepatic or extrahepatic
ductal dilatation.

Kidneys are within normal limits.  No hydronephrosis.

No evidence of bowel obstruction.  Normal appendix.  Mildly
thickened appearance of the cecum/ileocecal valve is likely related
to underdistension.

No evidence of abdominal aortic aneurysm.

No abdominopelvic ascites.

No suspicious abdominopelvic lymphadenopathy.

Prostate is unremarkable.

Bladder is within normal limits.

Visualized osseous structures are within normal limits.
IMPRESSION: Normal appendix.  No evidence of bowel obstruction.

No CT findings to account for the patient's right lower quadrant
abdominal pain.

## 2016-06-30 IMAGING — CR DG CHEST 2V
2 series · 2 of 2 positions shown · non-contrast
Comparison: 06/27/2013 ; correlation CT chest abdomen and pelvis
02/10/2013

CLINICAL DATA: Cough, LEFT side chest pain for 1 week now radiating
across chest, smoker

EXAM:
CHEST  2 VIEW

[w chest pa]
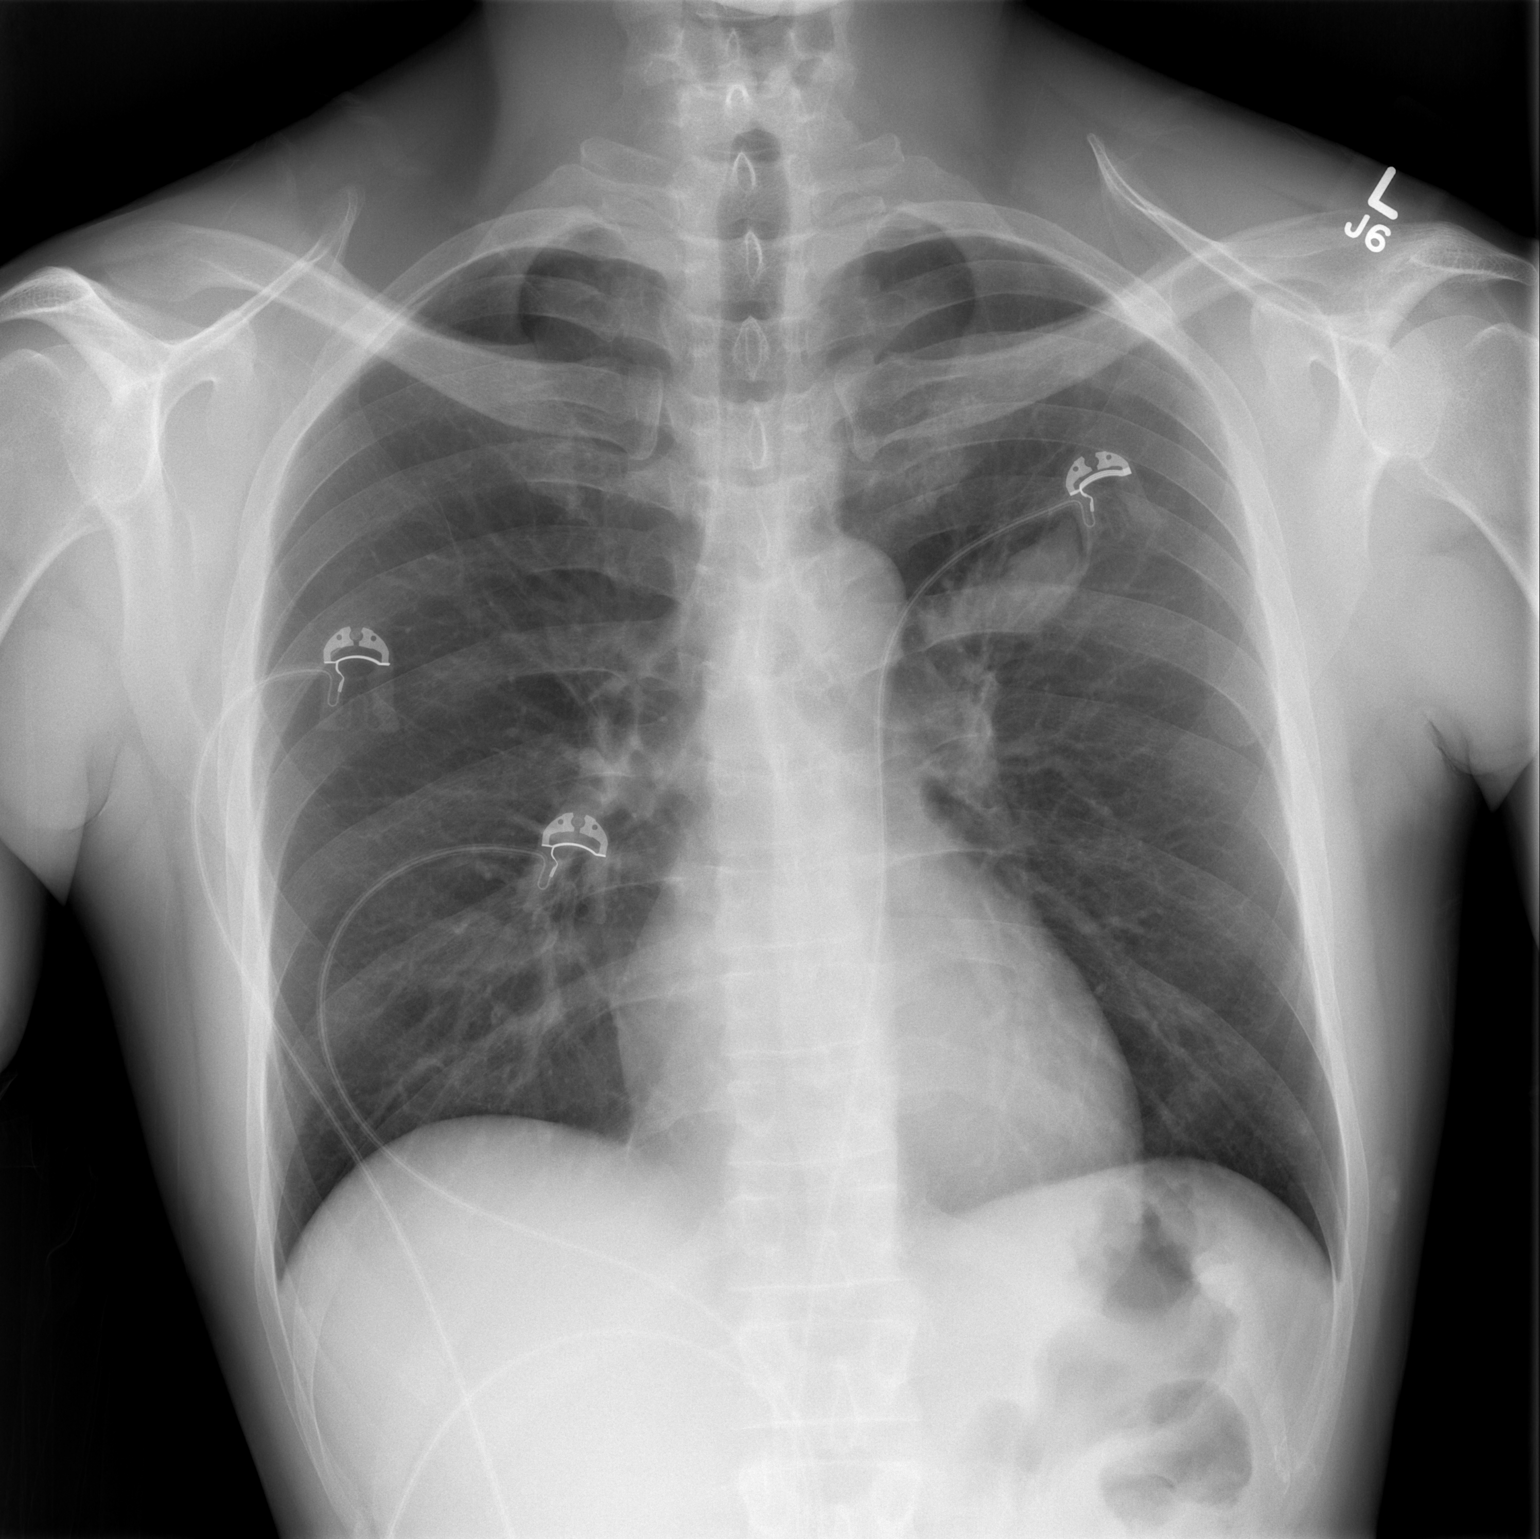

[w chest lat]
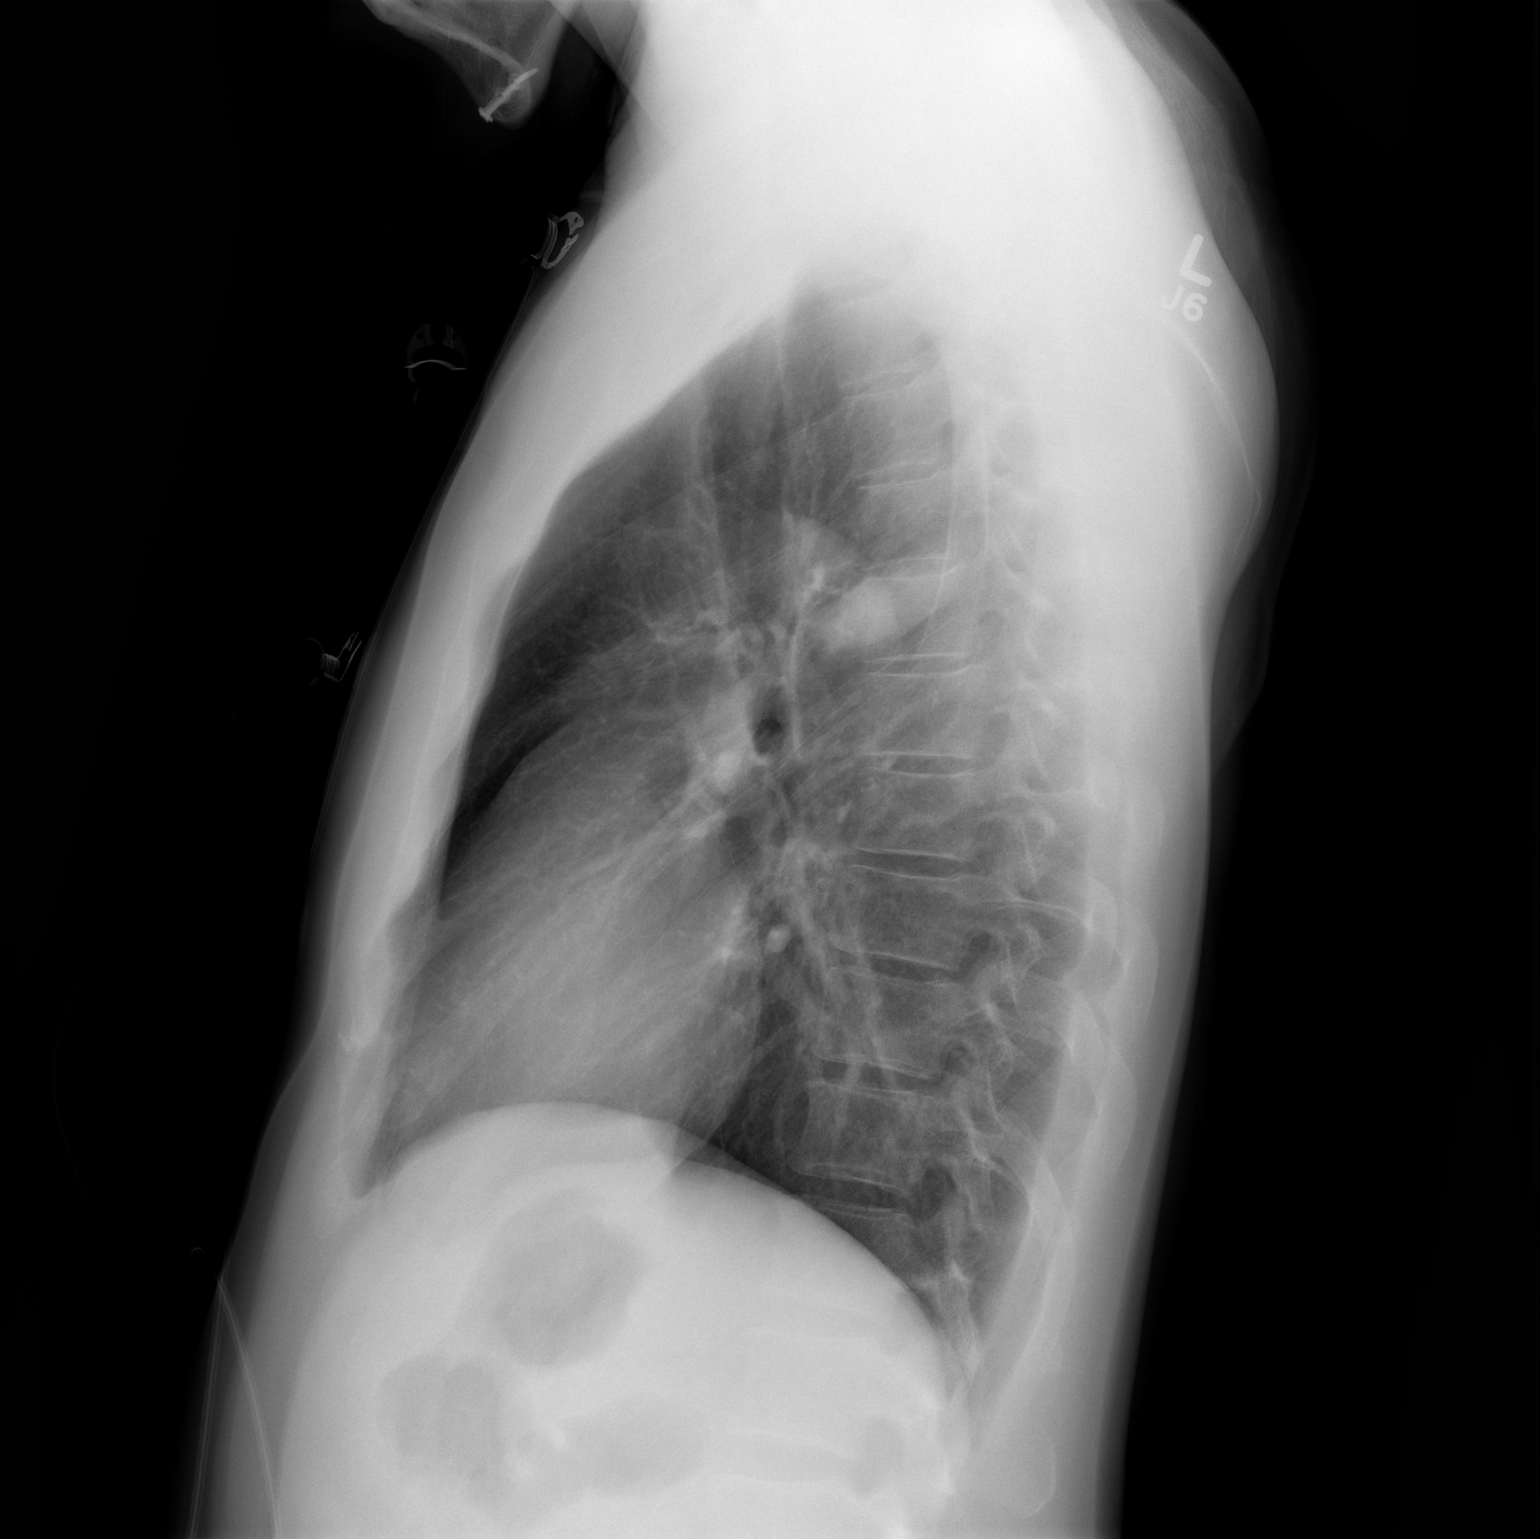

[2 of 2 positions shown; findings below may reference images not displayed]

FINDINGS: Normal heart size, mediastinal contours and pulmonary vascularity.

[place] LEFT upper lobe density again identified, unchanged.

Lungs otherwise clear.

No pleural effusion or pneumothorax.

EKG leads project over chest.

Bones unremarkable.
IMPRESSION: Chronic oblong LEFT upper lobe opacity identified unchanged from
previous exam.

This corresponds to mucus within a dilated LEFT upper lobe bronchus
on prior CT.

No new radiographic abnormalities identified.

## 2016-08-08 ENCOUNTER — Encounter (HOSPITAL_BASED_OUTPATIENT_CLINIC_OR_DEPARTMENT_OTHER): Payer: Self-pay | Admitting: *Deleted

## 2016-08-08 ENCOUNTER — Emergency Department (HOSPITAL_BASED_OUTPATIENT_CLINIC_OR_DEPARTMENT_OTHER)
Admission: EM | Admit: 2016-08-08 | Discharge: 2016-08-08 | Disposition: A | Discharge status: Home / Self Care | Payer: Self-pay | Attending: Emergency Medicine | Admitting: Emergency Medicine

## 2016-08-08 DIAGNOSIS — K047 Periapical abscess without sinus: Secondary | ICD-10-CM | POA: Insufficient documentation

## 2016-08-08 DIAGNOSIS — R600 Localized edema: Secondary | ICD-10-CM | POA: Insufficient documentation

## 2016-08-08 DIAGNOSIS — F17210 Nicotine dependence, cigarettes, uncomplicated: Secondary | ICD-10-CM | POA: Insufficient documentation

## 2016-08-08 MED ORDER — CLINDAMYCIN HCL 300 MG PO CAPS: 300.0000 mg | ORAL_CAPSULE | Freq: Four times a day (QID) | ORAL | 0 refills | Status: DC

## 2016-08-08 MED ORDER — NAPROXEN 500 MG PO TABS: 500.0000 mg | ORAL_TABLET | Freq: Two times a day (BID) | ORAL | 0 refills | Status: DC

## 2016-08-08 NOTE — ED Notes (Signed)
ED Provider at bedside. 

## 2016-08-08 NOTE — Discharge Instructions (Signed)
Call the dentist's office to schedule an appointment, and medications as prescribed, return for fever or severe swelling, difficulty swallowing or breathing

## 2016-08-08 NOTE — ED Triage Notes (Signed)
Pt reports broken tooth upper left side of mouth. Pain since Monday. Left cheek swelling since Wednesday.

## 2016-08-08 NOTE — ED Provider Notes (Signed)
[facility] Note   CSN: [id number] Arrival date & time: 08/08/16  [id number]     History   Chief Complaint Chief Complaint  Patient presents with  . Facial Swelling    HPI Luis [name] is a 36 y.o. male.  HPI Patient presents to the emergency room with complaints of toothache and facial swelling. Patient states his symptoms started a couple days ago. It has been progressively getting worse. This morning he felt like his face was swollen. He has tenderness in the left upper maxillary region of his teeth. He's noticed swelling in the cheek in that region. He denies any known fevers but has felt chilled at times. He denies any vomiting or diarrhea. No difficulty swallowing or breathing. History reviewed. No pertinent past medical history.  There are no active problems to display for this patient.   Past Surgical History:  Procedure Laterality Date  . HAND SURGERY Right   . MANDIBLE SURGERY Right        Home Medications    Prior to Admission medications   Medication Sig Start Date End Date Taking? Authorizing Provider  clindamycin (CLEOCIN) 300 MG capsule Take 1 capsule (300 mg total) by mouth 4 (four) times daily. 08/08/16   Linwood DibblesKnapp, Halim Surrette, MD  HYDROcodone-acetaminophen (NORCO/VICODIN) 5-325 MG per tablet Take 1 tablet by mouth every 4 (four) hours as needed. 04/26/14   Ward, Layla MawKristen N, DO  ibuprofen (ADVIL,MOTRIN) 800 MG tablet Take 1 tablet (800 mg total) by mouth every 8 (eight) hours as needed for mild pain. 04/26/14   Ward, Layla MawKristen N, DO  naproxen (NAPROSYN) 500 MG tablet Take 1 tablet (500 mg total) by mouth 2 (two) times daily. 08/08/16   Linwood DibblesKnapp, Ramiro Pangilinan, MD  traMADol (ULTRAM) 50 MG tablet Take 1 tablet (50 mg total) by mouth every 6 (six) hours as needed. 02/18/14   Rolan BuccoBelfi, Melanie, MD    Family History No family history on file.  Social History Social History  Substance Use Topics  . Smoking status: Current Every Day Smoker    Packs/day: 0.50    Years: 15.00      Types: Cigarettes  . Smokeless tobacco: Never Used  . Alcohol use Yes     Comment: occasional     Allergies   Penicillins   Review of Systems Review of Systems  All other systems reviewed and are negative.    Physical Exam Updated Vital Signs BP (!) 138/94 (BP Location: Left Arm)   Pulse 78   Temp 98.8 F (37.1 C) (Oral)   Resp 18   Ht 1.854 m (6\' 1" )   Wt 74.4 kg (164 lb)   SpO2 100%   BMI 21.64 kg/m   Physical Exam  Constitutional: He appears well-developed and well-nourished. No distress.  HENT:  Head: Normocephalic and atraumatic.  Right Ear: External ear normal.  Left Ear: External ear normal.  Mouth/Throat: No trismus in the jaw. Dental abscesses and dental caries present.    Multiple areas of decayed teeth, teeth are decayed down to the gums, focal area of erythema, fluctuance and swelling in the gingiva of the left upper maxillary region  Eyes: Conjunctivae are normal. Right eye exhibits no discharge. Left eye exhibits no discharge. No scleral icterus.  Neck: Neck supple. No tracheal deviation present.  Cardiovascular: Normal rate.   Pulmonary/Chest: Effort normal. No stridor. No respiratory distress.  Abdominal: He exhibits no distension.  Musculoskeletal: He exhibits no edema.  Neurological: He is alert. Cranial nerve deficit: no gross deficits.  Skin: Skin is warm and dry. No rash noted.  Psychiatric: He has a normal mood and affect.  Nursing note and vitals reviewed.    ED Treatments / Results  Labs (all labs ordered are listed, but only abnormal results are displayed) Labs Reviewed - No data to display  EKG  EKG Interpretation None       Radiology No results found.  Procedures .Marland KitchenIncision and Drainage Date/Time: 08/08/2016 9:12 AM Performed by: Linwood Dibbles Authorized by: Linwood Dibbles   Consent:    Consent obtained:  Verbal   Consent given by:  Patient   Risks discussed:  Bleeding, incomplete drainage, pain and damage to other  organs   Alternatives discussed:  No treatment Location:    Type:  Abscess   Location:  Mouth Anesthesia (see MAR for exact dosages):    Anesthesia method:  Topical application   Topical anesthesia: dental gel. Procedure type:    Complexity:  Simple Procedure details:    Needle aspiration: no     Incision types:  Stab incision   Incision depth:  Submucosal   Scalpel blade:  11   Drainage:  Purulent   Drainage amount:  Moderate   Packing materials:  None Post-procedure details:    Patient tolerance of procedure:  Tolerated well, no immediate complications   (including critical care time)  Medications Ordered in ED Medications - No data to display   Initial Impression / Assessment and Plan / ED Course  I have reviewed the triage vital signs and the nursing notes.  Pertinent labs & imaging results that were available during my care of the patient were reviewed by me and considered in my medical decision making (see chart for details).   patient presented to the emergency room with dental pain and swelling. Patient has very poor dentition and multiple dental caries. He had obvious swelling of the gingival mucosa.  The abscess was lanced using an 11 blade. Patient tolerated it well. I'll discharge him home with oral antibiotics and pain medications. Follow-up with dentist as soon as possible.  Final Clinical Impressions(s) / ED Diagnoses   Final diagnoses:  Dental abscess    New Prescriptions New Prescriptions   CLINDAMYCIN (CLEOCIN) 300 MG CAPSULE    Take 1 capsule (300 mg total) by mouth 4 (four) times daily.   NAPROXEN (NAPROSYN) 500 MG TABLET    Take 1 tablet (500 mg total) by mouth 2 (two) times daily.     Linwood Dibbles, MD 08/08/16 [phone]

## 2017-07-18 ENCOUNTER — Encounter (HOSPITAL_BASED_OUTPATIENT_CLINIC_OR_DEPARTMENT_OTHER): Payer: Self-pay | Admitting: Emergency Medicine

## 2017-07-18 ENCOUNTER — Other Ambulatory Visit: Payer: Self-pay

## 2017-07-18 ENCOUNTER — Emergency Department (HOSPITAL_BASED_OUTPATIENT_CLINIC_OR_DEPARTMENT_OTHER)
Admission: EM | Admit: 2017-07-18 | Discharge: 2017-07-18 | Disposition: A | Discharge status: Home / Self Care | Payer: Managed Care, Other (non HMO) | Attending: Emergency Medicine | Admitting: Emergency Medicine

## 2017-07-18 DIAGNOSIS — H6002 Abscess of left external ear: Secondary | ICD-10-CM | POA: Insufficient documentation

## 2017-07-18 DIAGNOSIS — L0291 Cutaneous abscess, unspecified: Secondary | ICD-10-CM

## 2017-07-18 MED ORDER — LIDOCAINE HCL (PF) 1 % IJ SOLN
5.0000 mL | Freq: Once | INTRAMUSCULAR | Status: AC
  Administered 2017-07-18: 5 mL
  Filled 2017-07-18: qty 5

## 2017-07-18 NOTE — ED Triage Notes (Signed)
Abscess behind L ear.

## 2017-07-18 NOTE — ED Provider Notes (Signed)
[facility] EMERGENCY DEPARTMENT Provider Note   CSN: [id number] Arrival date & time: 07/18/17  [id number]     History   Chief Complaint Chief Complaint  Patient presents with  . Abscess    HPI Luis [name] is a 37 y.o. male.   Abscess  Location:  Head/neck Head/neck abscess location:  L ear Abscess quality: fluctuance and painful   Abscess quality: not draining   Red streaking: no   Duration:  2 days Progression:  Worsening Pain details:    Quality:  Dull   Severity:  Moderate   Timing:  Constant Chronicity:  New Context: not diabetes, not immunosuppression, not injected drug use, not insect bite/sting and not skin injury   Relieved by:  Nothing Exacerbated by: palpation. Ineffective treatments:  None tried Associated symptoms: no anorexia, no fever, no nausea and no vomiting     History reviewed. No pertinent past medical history.  There are no active problems to display for this patient.   Past Surgical History:  Procedure Laterality Date  . HAND SURGERY Right   . MANDIBLE SURGERY Right         Home Medications    Prior to Admission medications   Medication Sig Start Date End Date Taking? Authorizing Provider  clindamycin (CLEOCIN) 300 MG capsule Take 1 capsule (300 mg total) by mouth 4 (four) times daily. 08/08/16   Linwood DibblesKnapp, Kendry Pfarr, MD  HYDROcodone-acetaminophen (NORCO/VICODIN) 5-325 MG per tablet Take 1 tablet by mouth every 4 (four) hours as needed. 04/26/14   Ward, Layla MawKristen N, DO  ibuprofen (ADVIL,MOTRIN) 800 MG tablet Take 1 tablet (800 mg total) by mouth every 8 (eight) hours as needed for mild pain. 04/26/14   Ward, Layla MawKristen N, DO  naproxen (NAPROSYN) 500 MG tablet Take 1 tablet (500 mg total) by mouth 2 (two) times daily. 08/08/16   Linwood DibblesKnapp, Bleu Minerd, MD  omeprazole (PRILOSEC) 20 MG capsule TK ONE C PO QD 07/12/17   [provider]  traMADol (ULTRAM) 50 MG tablet Take 1 tablet (50 mg total) by mouth every 6 (six) hours as needed. 02/18/14   Rolan BuccoBelfi,  Melanie, MD    Family History No family history on file.  Social History Social History   Tobacco Use  . Smoking status: Current Every Day Smoker    Packs/day: 0.50    Years: 15.00    Pack years: 7.50    Types: Cigarettes  . Smokeless tobacco: Never Used  Substance Use Topics  . Alcohol use: Yes    Comment: occasional  . Drug use: Yes    Types: Marijuana     Allergies   Penicillins   Review of Systems Review of Systems  Constitutional: Negative for fever.  Gastrointestinal: Negative for anorexia, nausea and vomiting.  All other systems reviewed and are negative.    Physical Exam Updated Vital Signs BP (!) 141/113 (BP Location: Left Arm)   Pulse 86   Temp 98.1 F (36.7 C) (Oral)   Resp 18   Ht 1.854 m (6\' 1" )   Wt 74.4 kg (164 lb)   SpO2 100%   BMI 21.64 kg/m   Physical Exam  Constitutional: He appears well-developed and well-nourished. No distress.  HENT:  Head: Normocephalic and atraumatic.  Right Ear: External ear normal.  Left Ear: External ear normal.  approx 1 cm size superficial area of swelling behind left ear, small scratch noted at skin surface, no erythema or swelling of the ear  Eyes: Conjunctivae are normal. Right eye exhibits no discharge.  Left eye exhibits no discharge. No scleral icterus.  Neck: Neck supple. No tracheal deviation present.  No masses or swelling   Cardiovascular: Normal rate.  Pulmonary/Chest: Effort normal. No stridor. No respiratory distress.  Abdominal: He exhibits no distension.  Musculoskeletal: He exhibits no edema.  Lymphadenopathy:    He has no cervical adenopathy.  Neurological: He is alert. Cranial nerve deficit: no gross deficits.  Skin: Skin is warm and dry. No rash noted.  Psychiatric: He has a normal mood and affect.  Nursing note and vitals reviewed.    ED Treatments / Results  Labs (all labs ordered are listed, but only abnormal results are displayed) Labs Reviewed - No data to  display  EKG None  Radiology No results found.  Procedures .Marland KitchenIncision and Drainage Date/Time: 07/18/2017 8:50 AM Performed by: Linwood Dibbles, MD Authorized by: Linwood Dibbles, MD   Consent:    Consent obtained:  Verbal   Consent given by:  Patient   Risks discussed:  Bleeding, incomplete drainage, pain and damage to other organs   Alternatives discussed:  No treatment Universal protocol:    Procedure explained and questions answered to patient or proxy's satisfaction: yes     Relevant documents present and verified: yes     Test results available and properly labeled: yes     Imaging studies available: yes     Required blood products, implants, devices, and special equipment available: yes     Site/side marked: yes     Immediately prior to procedure a time out was called: yes     Patient identity confirmed:  Verbally with patient Location:    Type:  Abscess   Location: behind left ear. Pre-procedure details:    Skin preparation:  Betadine Anesthesia (see MAR for exact dosages):    Anesthesia method:  Local infiltration   Local anesthetic:  Lidocaine 1% w/o epi Procedure type:    Complexity:  Complex Procedure details:    Needle aspiration: no     Incision types:  Single straight   Incision depth:  Subcutaneous   Scalpel blade:  11   Wound management:  Probed and deloculated, irrigated with saline and extensive cleaning   Drainage:  Purulent   Drainage amount:  Moderate   Packing materials:  1/4 in gauze   Amount 1/4":  1cm Post-procedure details:    Patient tolerance of procedure:  Tolerated well, no immediate complications   (including critical care time)  Medications Ordered in ED Medications  lidocaine (PF) (XYLOCAINE) 1 % injection 5 mL (5 mLs Infiltration Given 07/18/17 0829)     Initial Impression / Assessment and Plan / ED Course  I have reviewed the triage vital signs and the nursing notes.  Pertinent labs & imaging results that were available during my care  of the patient were reviewed by me and considered in my medical decision making (see chart for details).   Patient presents with a small superficial abscess behind his ear.  There does appear to be either small black and/or small scratch behind his ear.  No signs of any lymphangitic streaking.  No trismus or any other symptoms to suggest a deeper abscess.  Patient tolerated I&D without difficulty.  Follow-up per DC instructions  Final Clinical Impressions(s) / ED Diagnoses   Final diagnoses:  Abscess    ED Discharge Orders    None       Linwood Dibbles, MD 07/18/17 [id number]

## 2017-07-18 NOTE — Discharge Instructions (Addendum)
You can try applying warm compresses to the ear to help promote drainage, remove the bandage and packing in 2 days, return to the ED to have the wound rechecked if you do not feel that it significantly better or to have us remove the packing

## 2022-06-23 ENCOUNTER — Encounter (HOSPITAL_BASED_OUTPATIENT_CLINIC_OR_DEPARTMENT_OTHER): Payer: Self-pay | Admitting: Urology

## 2022-06-23 ENCOUNTER — Emergency Department (HOSPITAL_BASED_OUTPATIENT_CLINIC_OR_DEPARTMENT_OTHER)
Admission: EM | Admit: 2022-06-23 | Discharge: 2022-06-23 | Disposition: A | Discharge status: Home / Self Care | Payer: Commercial Managed Care - HMO | Attending: Emergency Medicine | Admitting: Emergency Medicine

## 2022-06-23 ENCOUNTER — Emergency Department (HOSPITAL_BASED_OUTPATIENT_CLINIC_OR_DEPARTMENT_OTHER): Payer: Commercial Managed Care - HMO

## 2022-06-23 ENCOUNTER — Other Ambulatory Visit: Payer: Self-pay

## 2022-06-23 DIAGNOSIS — M7989 Other specified soft tissue disorders: Secondary | ICD-10-CM | POA: Diagnosis present

## 2022-06-23 DIAGNOSIS — R600 Localized edema: Secondary | ICD-10-CM

## 2022-06-23 LAB — COMPREHENSIVE METABOLIC PANEL
ALT: 15 U/L (ref 0–44)
AST: 18 U/L (ref 15–41)
Albumin: 4.3 g/dL (ref 3.5–5.0)
Alkaline Phosphatase: 51 U/L (ref 38–126)
Anion gap: 7 (ref 5–15)
BUN: 8 mg/dL (ref 6–20)
CO2: 28 mmol/L (ref 22–32)
Calcium: 9.4 mg/dL (ref 8.9–10.3)
Chloride: 102 mmol/L (ref 98–111)
Creatinine, Ser: 0.77 mg/dL (ref 0.61–1.24)
GFR, Estimated: >60 mL/min (ref >60)
Glucose, Bld: 100 mg/dL — ABNORMAL HIGH (ref 70–99)
Potassium: 4 mmol/L (ref 3.5–5.1)
Sodium: 137 mmol/L (ref 135–145)
Total Bilirubin: 0.8 mg/dL (ref 0.3–1.2)
Total Protein: 7 g/dL (ref 6.5–8.1)

## 2022-06-23 LAB — CBC WITH DIFFERENTIAL/PLATELET
Abs Immature Granulocytes: 0.01 10*3/uL (ref 0.00–0.07)
Basophils Absolute: 0.1 10*3/uL (ref 0.0–0.1)
Basophils Relative: 1 %
Eosinophils Absolute: 0.4 10*3/uL (ref 0.0–0.5)
Eosinophils Relative: 9 %
HCT: 39.1 % (ref 39.0–52.0)
Hemoglobin: 12.9 g/dL — ABNORMAL LOW (ref 13.0–17.0)
Immature Granulocytes: 0 %
Lymphocytes Relative: 49 %
Lymphs Abs: 2.3 10*3/uL (ref 0.7–4.0)
MCH: 30.7 pg (ref 26.0–34.0)
MCHC: 33 g/dL (ref 30.0–36.0)
MCV: 93.1 fL (ref 80.0–100.0)
Monocytes Absolute: 0.6 10*3/uL (ref 0.1–1.0)
Monocytes Relative: 12 %
Neutro Abs: 1.4 10*3/uL — ABNORMAL LOW (ref 1.7–7.7)
Neutrophils Relative %: 29 %
Platelets: 243 10*3/uL (ref 150–400)
RBC: 4.2 MIL/uL — ABNORMAL LOW (ref 4.22–5.81)
RDW: 12.8 % (ref 11.5–15.5)
WBC: 4.7 10*3/uL (ref 4.0–10.5)
nRBC: 0 % (ref 0.0–0.2)

## 2022-06-23 LAB — BRAIN NATRIURETIC PEPTIDE: B Natriuretic Peptide: 184.3 pg/mL — ABNORMAL HIGH (ref 0.0–100.0)

## 2022-06-23 NOTE — ED Notes (Signed)
Pt states that he has had  swelling in lower legs x  1 month states saw his dr and was put on gabapentin for the pain and was upset that he had to wait till next Friday for an [place] also is  to see his dermatologist for the darknplace on his left lower leg

## 2022-06-23 NOTE — ED Provider Notes (Signed)
[place] EMERGENCY DEPARTMENT AT [facility] Provider Note   CSN: [id number] Arrival date & time: 06/23/22  1024     History  Chief Complaint  Patient presents with   Leg Swelling    Luis [name] is a 42 y.o. male.  42 yo M with a chief complaint of bilateral lower extremity edema.  Going on for about a month.  He denies any specific change to his life.  Tells me that he was seen by his family doctor and was started on a medication for chronic pain in his feet.  Since then he noticed the ongoing swelling.  He has developed some discoloration to his legs.  Has been referred to dermatology.  Plans for ultrasound this week.  He denies any chest pain or difficulty breathing.  Denies orthopnea or PND.  Denies any exertional symptoms.        Home Medications Prior to Admission medications   Medication Sig Start Date End Date Taking? Authorizing Provider  clindamycin (CLEOCIN) 300 MG capsule Take 1 capsule (300 mg total) by mouth 4 (four) times daily. 08/08/16   Linwood Dibbles, MD  HYDROcodone-acetaminophen (NORCO/VICODIN) 5-325 MG per tablet Take 1 tablet by mouth every 4 (four) hours as needed. 04/26/14   Ward, Layla Maw, DO  ibuprofen (ADVIL,MOTRIN) 800 MG tablet Take 1 tablet (800 mg total) by mouth every 8 (eight) hours as needed for mild pain. 04/26/14   Ward, Layla Maw, DO  naproxen (NAPROSYN) 500 MG tablet Take 1 tablet (500 mg total) by mouth 2 (two) times daily. 08/08/16   Linwood Dibbles, MD  omeprazole (PRILOSEC) 20 MG capsule TK ONE C PO QD 07/12/17   [provider]  traMADol (ULTRAM) 50 MG tablet Take 1 tablet (50 mg total) by mouth every 6 (six) hours as needed. 02/18/14   Rolan Bucco, MD      Allergies    Penicillins    Review of Systems   Review of Systems  Physical Exam Updated Vital Signs BP (!) 151/107   Pulse 79   Temp 98 F (36.7 C)   Resp 18   Ht 6\' 1"  (1.854 m)   Wt 74.4 kg   SpO2 97%   BMI 21.64 kg/m  Physical Exam Vitals and nursing  note reviewed.  Constitutional:      Appearance: He is well-developed.  HENT:     Head: Normocephalic and atraumatic.  Eyes:     Pupils: Pupils are equal, round, and reactive to light.  Neck:     Vascular: No JVD.  Cardiovascular:     Rate and Rhythm: Normal rate and regular rhythm.     Heart sounds: No murmur heard.    No friction rub. No gallop.  Pulmonary:     Effort: No respiratory distress.     Breath sounds: No wheezing.  Abdominal:     General: There is no distension.     Tenderness: There is no abdominal tenderness. There is no guarding or rebound.  Musculoskeletal:        General: Normal range of motion.     Cervical back: Normal range of motion and neck supple.     Right lower leg: Edema present.     Left lower leg: Edema present.     Comments: Swelling up to the knees bilaterally.  Does have some hyperpigmentation that is keratonotic on either leg.  He has strong dorsalis pedis pulses bilaterally.  Skin:    Coloration: Skin is not pale.  Findings: No rash.  Neurological:     Mental Status: He is alert and oriented to person, place, and time.  Psychiatric:        Behavior: Behavior normal.     ED Results / Procedures / Treatments   Labs (all labs ordered are listed, but only abnormal results are displayed) Labs Reviewed  CBC WITH DIFFERENTIAL/PLATELET - Abnormal; Notable for the following components:      Result Value   RBC 4.20 (*)    Hemoglobin 12.9 (*)    Neutro Abs 1.4 (*)    All other components within normal limits  COMPREHENSIVE METABOLIC PANEL - Abnormal; Notable for the following components:   Glucose, Bld 100 (*)    All other components within normal limits  BRAIN NATRIURETIC PEPTIDE - Abnormal; Notable for the following components:   B Natriuretic Peptide 184.3 (*)    All other components within normal limits    EKG None  Radiology DG Chest Port 1 View  Result Date: 06/23/2022 CLINICAL DATA:  Edema.  Swollen legs and feet for a month  EXAM: PORTABLE CHEST 1 VIEW COMPARISON:  X-ray 02/13/2020 and older FINDINGS: Borderline cardiopericardial silhouette. No consolidation, pneumothorax or effusion. No edema. Left midlung scar or atelectasis. IMPRESSION: Borderline size heart.  Left midlung scar or atelectasis. Electronically Signed   By: Karen Kays M.D.   On: 06/23/2022 11:47    Procedures Procedures    Medications Ordered in ED Medications - No data to display  ED Course/ Medical Decision Making/ A&P                             Medical Decision Making Amount and/or Complexity of Data Reviewed Labs: ordered. Radiology: ordered.   42 yo M with a chief complaint of bilateral lower extremity edema.  Going on for about a month.  I wonder if the patient has some sort of new rheumatologic condition.  Unfortunately I do not have rheumatology on-call.  He seems to have good follow-up with his family doctor.  Currently scheduled to see a dermatologist plan to obtain vascular studies of his legs.  He has good pulses here and so I do not feel he benefit from a DVT study or a CT scan.  Will recheck blood work here for acute change.  My record review the patient actually has been recently worked up for this in the ER setting, had negative HIV and syphilis which I also considered testing here.  BNP is trivially elevated here.  No significant electrolyte abnormality.  No significant anemia.  There was a study that suggested that a more early ultrasound of the heart could be beneficial in this patient population.  Encouraged the patient to contact his PCP with this information and see what they think.  Will have him return for any worsening.  1:34 PM:  I have discussed the diagnosis/risks/treatment options with the patient.  Evaluation and diagnostic testing in the emergency department does not suggest an emergent condition requiring admission or immediate intervention beyond what has been performed at this time.  They will follow up with  PCP. We also discussed returning to the ED immediately if new or worsening sx occur. We discussed the sx which are most concerning (e.g., sudden worsening pain, fever, inability to tolerate by mouth) that necessitate immediate return. Medications administered to the patient during their visit and any new prescriptions provided to the patient are listed below.  Medications given during  this visit Medications - No data to display   The patient appears reasonably screen and/or stabilized for discharge and I doubt any other medical condition or other [facility] requiring further screening, evaluation, or treatment in the ED at this time prior to discharge.          Final Clinical Impression(s) / ED Diagnoses Final diagnoses:  Peripheral edema    Rx / DC Orders ED Discharge Orders     None         Melene Plan, DO 06/23/22 1334

## 2022-06-23 NOTE — Discharge Instructions (Signed)
There is no obvious cause for your symptoms based on her blood work.  Your marker for heart failure is very mildly elevated.  There have been some research studies that suggest that getting an ultrasound of your heart make sense for this.  Please let your family doctor know and they may order you an outpatient echocardiogram.  Try to elevate your legs above your heart whenever you are not up and moving around.  Exercise typically can help with this as well.

## 2022-06-23 NOTE — ED Triage Notes (Signed)
Pt states bilateral leg and feet swelling x 1 month  States swelling now radiating up legs  +3 pitting edema noted to BLE

## 2022-09-23 ENCOUNTER — Other Ambulatory Visit: Payer: Self-pay

## 2022-09-23 ENCOUNTER — Encounter (HOSPITAL_BASED_OUTPATIENT_CLINIC_OR_DEPARTMENT_OTHER): Payer: Self-pay | Admitting: Pediatrics

## 2022-09-23 ENCOUNTER — Emergency Department (HOSPITAL_BASED_OUTPATIENT_CLINIC_OR_DEPARTMENT_OTHER): Payer: Commercial Managed Care - HMO

## 2022-09-23 ENCOUNTER — Inpatient Hospital Stay (HOSPITAL_BASED_OUTPATIENT_CLINIC_OR_DEPARTMENT_OTHER)
Admission: EM | Admit: 2022-09-23 | Discharge: 2022-09-25 | DRG: 392 | Disposition: A | Discharge status: Home / Self Care | Payer: Commercial Managed Care - HMO | Attending: Family Medicine | Admitting: Family Medicine

## 2022-09-23 DIAGNOSIS — R112 Nausea with vomiting, unspecified: Secondary | ICD-10-CM | POA: Diagnosis not present

## 2022-09-23 DIAGNOSIS — R509 Fever, unspecified: Secondary | ICD-10-CM | POA: Diagnosis present

## 2022-09-23 DIAGNOSIS — R030 Elevated blood-pressure reading, without diagnosis of hypertension: Secondary | ICD-10-CM | POA: Diagnosis present

## 2022-09-23 DIAGNOSIS — R7401 Elevation of levels of liver transaminase levels: Secondary | ICD-10-CM

## 2022-09-23 DIAGNOSIS — R638 Other symptoms and signs concerning food and fluid intake: Secondary | ICD-10-CM

## 2022-09-23 DIAGNOSIS — K219 Gastro-esophageal reflux disease without esophagitis: Secondary | ICD-10-CM | POA: Diagnosis present

## 2022-09-23 DIAGNOSIS — Z7141 Alcohol abuse counseling and surveillance of alcoholic: Secondary | ICD-10-CM

## 2022-09-23 DIAGNOSIS — E11649 Type 2 diabetes mellitus with hypoglycemia without coma: Secondary | ICD-10-CM | POA: Diagnosis present

## 2022-09-23 DIAGNOSIS — F1010 Alcohol abuse, uncomplicated: Secondary | ICD-10-CM | POA: Diagnosis present

## 2022-09-23 DIAGNOSIS — F17210 Nicotine dependence, cigarettes, uncomplicated: Secondary | ICD-10-CM | POA: Diagnosis present

## 2022-09-23 DIAGNOSIS — Z1152 Encounter for screening for COVID-19: Secondary | ICD-10-CM

## 2022-09-23 DIAGNOSIS — Z880 Allergy status to penicillin: Secondary | ICD-10-CM

## 2022-09-23 DIAGNOSIS — E8729 Other acidosis: Secondary | ICD-10-CM

## 2022-09-23 DIAGNOSIS — K8020 Calculus of gallbladder without cholecystitis without obstruction: Secondary | ICD-10-CM

## 2022-09-23 DIAGNOSIS — E871 Hypo-osmolality and hyponatremia: Secondary | ICD-10-CM | POA: Diagnosis present

## 2022-09-23 DIAGNOSIS — K760 Fatty (change of) liver, not elsewhere classified: Secondary | ICD-10-CM | POA: Diagnosis present

## 2022-09-23 DIAGNOSIS — E875 Hyperkalemia: Secondary | ICD-10-CM | POA: Diagnosis present

## 2022-09-23 DIAGNOSIS — E1140 Type 2 diabetes mellitus with diabetic neuropathy, unspecified: Secondary | ICD-10-CM | POA: Diagnosis present

## 2022-09-23 DIAGNOSIS — Z79899 Other long term (current) drug therapy: Secondary | ICD-10-CM

## 2022-09-23 DIAGNOSIS — R109 Unspecified abdominal pain: Secondary | ICD-10-CM | POA: Diagnosis present

## 2022-09-23 HISTORY — DX: Other specified abnormal findings of blood chemistry: R79.89

## 2022-09-23 HISTORY — DX: Polyneuropathy, unspecified: G62.9

## 2022-09-23 HISTORY — DX: Alcohol abuse, uncomplicated: F10.10

## 2022-09-23 LAB — HEPATIC FUNCTION PANEL
ALT: 141 U/L — ABNORMAL HIGH (ref 0–44)
AST: 237 U/L — ABNORMAL HIGH (ref 15–41)
Albumin: 5.3 g/dL — ABNORMAL HIGH (ref 3.5–5.0)
Alkaline Phosphatase: 93 U/L (ref 38–126)
Bilirubin, Direct: 0.2 mg/dL (ref 0.0–0.2)
Indirect Bilirubin: 1.5 mg/dL — ABNORMAL HIGH (ref 0.3–0.9)
Total Bilirubin: 1.7 mg/dL — ABNORMAL HIGH (ref 0.3–1.2)
Total Protein: 8.8 g/dL — ABNORMAL HIGH (ref 6.5–8.1)

## 2022-09-23 LAB — BASIC METABOLIC PANEL
Anion gap: 24 — ABNORMAL HIGH (ref 5–15)
Anion gap: 15 (ref 5–15)
BUN: 14 mg/dL (ref 6–20)
BUN: 12 mg/dL (ref 6–20)
CO2: 15 mmol/L — ABNORMAL LOW (ref 22–32)
CO2: 18 mmol/L — ABNORMAL LOW (ref 22–32)
Calcium: 9.8 mg/dL (ref 8.9–10.3)
Calcium: 8.7 mg/dL — ABNORMAL LOW (ref 8.9–10.3)
Chloride: 94 mmol/L — ABNORMAL LOW (ref 98–111)
Chloride: 100 mmol/L (ref 98–111)
Creatinine, Ser: 1.16 mg/dL (ref 0.61–1.24)
Creatinine, Ser: 0.87 mg/dL (ref 0.61–1.24)
GFR, Estimated: >60 mL/min (ref >60)
GFR, Estimated: >60 mL/min (ref >60)
Glucose, Bld: 62 mg/dL — ABNORMAL LOW (ref 70–99)
Glucose, Bld: 83 mg/dL (ref 70–99)
Potassium: 5.2 mmol/L — ABNORMAL HIGH (ref 3.5–5.1)
Potassium: 4.1 mmol/L (ref 3.5–5.1)
Sodium: 133 mmol/L — ABNORMAL LOW (ref 135–145)
Sodium: 133 mmol/L — ABNORMAL LOW (ref 135–145)

## 2022-09-23 LAB — CBC
HCT: 44 % (ref 39.0–52.0)
Hemoglobin: 15.3 g/dL (ref 13.0–17.0)
MCH: 31.1 pg (ref 26.0–34.0)
MCHC: 34.8 g/dL (ref 30.0–36.0)
MCV: 89.4 fL (ref 80.0–100.0)
Platelets: 171 10*3/uL (ref 150–400)
RBC: 4.92 MIL/uL (ref 4.22–5.81)
RDW: 12.7 % (ref 11.5–15.5)
WBC: 6.4 10*3/uL (ref 4.0–10.5)
nRBC: 0 % (ref 0.0–0.2)

## 2022-09-23 LAB — I-STAT VENOUS BLOOD GAS, ED
Acid-base deficit: 7 mmol/L — ABNORMAL HIGH (ref 0.0–2.0)
Bicarbonate: 19.8 mmol/L — ABNORMAL LOW (ref 20.0–28.0)
Calcium, Ion: 1.3 mmol/L (ref 1.15–1.40)
HCT: 49 % (ref 39.0–52.0)
Hemoglobin: 16.7 g/dL (ref 13.0–17.0)
O2 Saturation: 15 %
Patient temperature: 97.7
Potassium: 5.3 mmol/L — ABNORMAL HIGH (ref 3.5–5.1)
Sodium: 130 mmol/L — ABNORMAL LOW (ref 135–145)
TCO2: 21 mmol/L — ABNORMAL LOW (ref 22–32)
pCO2, Ven: 44.2 mmHg (ref 44–60)
pH, Ven: 7.256 (ref 7.25–7.43)
pO2, Ven: <15 mmHg — CL (ref 32–45)

## 2022-09-23 LAB — CBG MONITORING, ED
Glucose-Capillary: 44 mg/dL — CL (ref 70–99)
Glucose-Capillary: 210 mg/dL — ABNORMAL HIGH (ref 70–99)

## 2022-09-23 LAB — URINALYSIS, ROUTINE W REFLEX MICROSCOPIC
Bilirubin Urine: NEGATIVE
Glucose, UA: NEGATIVE mg/dL
Ketones, ur: >=80 mg/dL — AB
Leukocytes,Ua: NEGATIVE
Nitrite: NEGATIVE
Protein, ur: 100 mg/dL — AB
Specific Gravity, Urine: >=1.03 (ref 1.005–1.030)
pH: 5.5 (ref 5.0–8.0)

## 2022-09-23 LAB — URINALYSIS, MICROSCOPIC (REFLEX)

## 2022-09-23 LAB — SARS CORONAVIRUS 2 BY RT PCR: SARS Coronavirus 2 by RT PCR: NEGATIVE

## 2022-09-23 LAB — CK: Total CK: 176 U/L (ref 49–397)

## 2022-09-23 LAB — LACTIC ACID, PLASMA: Lactic Acid, Venous: 1.8 mmol/L (ref 0.5–1.9)

## 2022-09-23 LAB — T4, FREE: Free T4: 0.54 ng/dL — ABNORMAL LOW (ref 0.61–1.12)

## 2022-09-23 LAB — TSH: TSH: 1.272 u[IU]/mL (ref 0.350–4.500)

## 2022-09-23 MED ORDER — SODIUM CHLORIDE 0.9 % IV BOLUS
1000.0000 mL | Freq: Once | INTRAVENOUS | Status: AC
  Administered 2022-09-23: 1000 mL via INTRAVENOUS

## 2022-09-23 MED ORDER — THIAMINE HCL 100 MG/ML IJ SOLN
100.0000 mg | Freq: Once | INTRAMUSCULAR | Status: AC
  Administered 2022-09-23: 100 mg via INTRAVENOUS
  Filled 2022-09-23: qty 2

## 2022-09-23 MED ORDER — MORPHINE SULFATE (PF) 4 MG/ML IV SOLN
4.0000 mg | Freq: Once | INTRAVENOUS | Status: AC
  Administered 2022-09-23: 4 mg via INTRAVENOUS
  Filled 2022-09-23: qty 1

## 2022-09-23 MED ORDER — MORPHINE SULFATE (PF) 2 MG/ML IV SOLN
2.0000 mg | Freq: Once | INTRAVENOUS | Status: AC
  Administered 2022-09-23: 2 mg via INTRAVENOUS
  Filled 2022-09-23: qty 1

## 2022-09-23 MED ORDER — ONDANSETRON HCL 4 MG/2ML IJ SOLN
4.0000 mg | Freq: Once | INTRAMUSCULAR | Status: AC
  Administered 2022-09-23: 4 mg via INTRAVENOUS
  Filled 2022-09-23: qty 2

## 2022-09-23 MED ORDER — DEXTROSE 50 % IV SOLN
1.0000 | Freq: Once | INTRAVENOUS | Status: AC
  Administered 2022-09-23: 50 mL via INTRAVENOUS
  Filled 2022-09-23: qty 50

## 2022-09-23 MED ORDER — KETOROLAC TROMETHAMINE 15 MG/ML IJ SOLN
15.0000 mg | Freq: Once | INTRAMUSCULAR | Status: AC
  Administered 2022-09-23: 15 mg via INTRAVENOUS
  Filled 2022-09-23: qty 1

## 2022-09-23 NOTE — ED Provider Notes (Signed)
9:11 PM Assumed care of patient from off-going team. For more details, please see note from same day.  In brief, this is a 42 y.o. male w/ decreased PO intake x 2-3 days p/w generalized body aches. Found to have likely starvation vs alcoholic ketoacidosis. S/p thiamine, folate, 2L IVF, morphine. Awaiting repeat BMP.   BP 137/86 (BP Location: Left Arm)   Pulse 67   Temp 98 F (36.7 C) (Oral)   Resp 16   Ht 6\' 1"  (1.854 m)   Wt 68.1 kg   SpO2 100%   BMI 19.80 kg/m    ED Course:   Clinical Course as of 09/23/22 2111  Tue Sep 23, 2022  1816 Basic metabolic panel(!) BMP greatly improved after fluids, dextrose.  [HN]  1849 AST(!): 237 [HN]  1850 ALT(!): 141 +Transaminitis in EtOH pattern c/w values one year ago but elevated from most recent values. He does have some RUQ TTP and epigastric pain. He states it is an acid-reflux type feeling. Then thinks he was running a fever yesterday. Do believe he warrants RUQ [place] to eval for cholestasis/cholecystitis. [HN]  2015 US Abdomen Limited RUQ (LIVER/GB) 1. Contracted gallbladder with cholelithiasis. No evidence of acute cholecystitis. 2. Hepatic steatosis.   [HN]  2015 Pt w/ symptomatic cholelithiasis w/ decreased PO intake and elevated LFTs. Will consult for admission and GI. [HN]  2024 GI recommends surgical consult d/t normal CBD.  [HN]  2039 Discussed with Dr. Magnus Ivan from surgery who recommends medicine admit and will follow [HN]  2110 Patient admitted to medicine w/ surgery following. Will be transferred to [facility]. [HN]    Clinical Course User Index [HN] Loetta Rough, MD    ------------------------------- Vivi Barrack, MD Emergency Medicine  This note was created using dictation software, which may contain spelling or grammatical errors.   Loetta Rough, MD 09/23/22 2111

## 2022-09-23 NOTE — ED Triage Notes (Signed)
C/O body aches ,fever and runny nose since Sunday,

## 2022-09-23 NOTE — ED Provider Notes (Signed)
[place] EMERGENCY DEPARTMENT AT [facility] Provider Note   CSN: [id number] Arrival date & time: 09/23/22  1009     History {Add pertinent medical, surgical, social history, OB history to HPI:1} Chief Complaint  Patient presents with   Generalized Body Aches    Kvon Trundy is a 42 y.o. male with overall noncontributory past medical history who presents concern for body aches, fever, runny nose since Sunday.  Unsure of any sick contacts but reports it is possible.  Patient reports that he has had no appetite for the last few days, but denies any overt nausea, vomiting.  He denies any chest pain, shortness of breath.  He does report some mild sore throat, nasal congestion.  Reports flashes at home but does not have anything to measure his temperature with.  HPI     Home Medications Prior to Admission medications   Medication Sig Start Date End Date Taking? Authorizing Provider  clindamycin (CLEOCIN) 300 MG capsule Take 1 capsule (300 mg total) by mouth 4 (four) times daily. 08/08/16   Linwood Dibbles, MD  HYDROcodone-acetaminophen (NORCO/VICODIN) 5-325 MG per tablet Take 1 tablet by mouth every 4 (four) hours as needed. 04/26/14   Ward, Layla Maw, DO  ibuprofen (ADVIL,MOTRIN) 800 MG tablet Take 1 tablet (800 mg total) by mouth every 8 (eight) hours as needed for mild pain. 04/26/14   Ward, Layla Maw, DO  naproxen (NAPROSYN) 500 MG tablet Take 1 tablet (500 mg total) by mouth 2 (two) times daily. 08/08/16   Linwood Dibbles, MD  omeprazole (PRILOSEC) 20 MG capsule TK ONE C PO QD 07/12/17   [provider]  traMADol (ULTRAM) 50 MG tablet Take 1 tablet (50 mg total) by mouth every 6 (six) hours as needed. 02/18/14   Rolan Bucco, MD      Allergies    Penicillins    Review of Systems   Review of Systems  All other systems reviewed and are negative.   Physical Exam Updated Vital Signs BP (!) 139/98 (BP Location: Left Arm)   Pulse (!) 101   Temp 97.7 F (36.5 C) (Oral)    Resp 18   Ht 6\' 1"  (1.854 m)   Wt 68.1 kg   SpO2 100%   BMI 19.80 kg/m  Physical Exam Vitals and nursing note reviewed.  Constitutional:      General: He is not in acute distress.    Appearance: Normal appearance.  HENT:     Head: Normocephalic and atraumatic.     Mouth/Throat:     Comments: mild posterior oropharynx erythema, without swelling, exudate. Uvula midline, tonsils 1+ bilaterally.  No trismus, stridor, evidence of PTA, floor of mouth swelling or redness.   Eyes:     General:        Right eye: No discharge.        Left eye: No discharge.  Cardiovascular:     Rate and Rhythm: Normal rate and regular rhythm.     Heart sounds: No murmur heard.    No friction rub. No gallop.  Pulmonary:     Effort: Pulmonary effort is normal.     Breath sounds: Normal breath sounds.  Abdominal:     General: Bowel sounds are normal.     Palpations: Abdomen is soft.  Skin:    General: Skin is warm and dry.     Capillary Refill: Capillary refill takes less than 2 seconds.  Neurological:     Mental Status: He is alert and oriented  to person, place, and time.  Psychiatric:        Mood and Affect: Mood normal.        Behavior: Behavior normal.    ED Results / Procedures / Treatments   Labs (all labs ordered are listed, but only abnormal results are displayed) Labs Reviewed  URINALYSIS, ROUTINE W REFLEX MICROSCOPIC - Abnormal; Notable for the following components:      Result Value   Hgb urine dipstick TRACE (*)    Ketones, ur >=80 (*)    Protein, ur 100 (*)    All other components within normal limits  URINALYSIS, MICROSCOPIC (REFLEX) - Abnormal; Notable for the following components:   Bacteria, UA RARE (*)    All other components within normal limits  SARS CORONAVIRUS 2 BY RT PCR  CBC  BASIC METABOLIC PANEL    EKG None  Radiology No results found.  Procedures Procedures  {Document cardiac monitor, telemetry assessment procedure when appropriate:1}  Medications  Ordered in ED Medications  ketorolac (TORADOL) 15 MG/ML injection 15 mg (has no administration in time range)  ondansetron (ZOFRAN) injection 4 mg (has no administration in time range)  sodium chloride 0.9 % bolus 1,000 mL (has no administration in time range)    ED Course/ Medical Decision Making/ A&P   {   Click here for ABCD2, HEART and other calculatorsREFRESH Note before signing :1}                              Medical Decision Making Amount and/or Complexity of Data Reviewed Labs: ordered.  Risk Prescription drug management.   This patient is a 42 y.o. male  who presents to the ED for concern of ***.   Differential diagnoses prior to evaluation: The emergent differential diagnosis includes, but is not limited to,  *** . This is not an exhaustive differential.   Past Medical History / Co-morbidities / Social History: ***  Additional history: Chart reviewed. Pertinent results include: ***  Physical Exam: Physical exam performed. The pertinent findings include: ***  Lab Tests/Imaging studies: I personally interpreted labs/imaging and the pertinent results include:  ***. ***I agree with the radiologist interpretation.  Cardiac monitoring: EKG obtained and interpreted by myself and attending physician which shows: ***   Medications: I ordered medication including ***.  I have reviewed the patients home medicines and have made adjustments as needed.   Disposition: After consideration of the diagnostic results and the patients response to treatment, I feel that *** .   ***emergency department workup does not suggest an emergent condition requiring admission or immediate intervention beyond what has been performed at this time. The plan is: ***. The patient is safe for discharge and has been instructed to return immediately for worsening symptoms, change in symptoms or any other concerns.  Patient has an anion gap greater than 12 mEq, the differential diagnosis includes  a lactic acidosis (tissue hypoxia to include: Cardiogenic, septic, hemorrhagic shock, seizure, carbon monoxide or cyanide poisoning),  non-hypoxic causes: Hepatic or renal renal failure, intestinal ischemia, diabetes with metformin use, ketoacidosis, infection, leukemia or lymphoma, drugs (including  toxic alcohols), AIDS, idiopathic) also diabetic/ starvation/alcoholic ketoacidosis (or a mix thereof) uremic acidosis, ethyl glycol toxicity, methanol toxicity, salicylate (mixed metabolic alkalosis***) paraldehyde, isoniazid, iron, rhabdomyolysis.   Final Clinical Impression(s) / ED Diagnoses Final diagnoses:  None    Rx / DC Orders ED Discharge Orders     None

## 2022-09-23 NOTE — ED Notes (Signed)
[facility] called for transport @21 :29 No ETA

## 2022-09-23 NOTE — ED Notes (Signed)
ED TO INPATIENT HANDOFF REPORT  ED Nurse Name and Phone #: Dortha Schwalbe, [phone]  S Name/Age/Gender Luis [name] 42 y.o. male Room/Bed: MH10/MH10  Code Status   Code Status: Not on file  Home/SNF/Other Home Patient oriented to: self, place, time, and situation Is this baseline? Yes   Triage Complete: Triage complete  Chief Complaint Abdominal pain [R10.9]  Triage Note C/O body aches ,fever and runny nose since Sunday,    Allergies Allergies  Allergen Reactions   Penicillins     Level of Care/Admitting Diagnosis ED Disposition     ED Disposition  Admit   Condition  --   Comment  Hospital Area: [facility] [[id number]]  Level of Care: Telemetry [5]  Admit to tele based on following criteria: Monitor for Ischemic changes  May admit patient to [facility] if equivalent level of care is available:: No  Interfacility transfer: Yes  Covid Evaluation: Asymptomatic - no recent exposure (last 10 days) testing not required  Diagnosis: Abdominal pain [098119]  Admitting Physician: Angie Fava [[id number]]  Attending Physician: Angie Fava [[id number]]  Certification:: I certify this patient will need inpatient services for at least 2 midnights  Expected Medical Readiness: 09/25/2022          B Medical/Surgery History History reviewed. No pertinent past medical history. Past Surgical History:  Procedure Laterality Date   HAND SURGERY Right    MANDIBLE SURGERY Right      A IV Location/Drains/Wounds Patient Lines/Drains/Airways Status     Active Line/Drains/Airways     Name Placement date Placement time Site Days   Peripheral IV 09/23/22 22 G 1" Left;Posterior Hand 09/23/22  1252  Hand  less than 1   Peripheral IV 09/23/22 20 G Right Antecubital 09/23/22  1454  Antecubital  less than 1            Intake/Output Last 24 hours  Intake/Output Summary (Last 24 hours) at 09/23/2022 2128 Last data filed at  09/23/2022 1652 Gross per 24 hour  Intake 2000 ml  Output --  Net 2000 ml    Labs/Imaging Results for orders placed or performed during the hospital encounter of 09/23/22 (from the past 48 hour(s))  SARS Coronavirus 2 by RT PCR (hospital order, performed in [facility] hospital lab) *cepheid single result test* Anterior Nasal Swab     Status: None   Collection Time: 09/23/22 10:23 AM   Specimen: Anterior Nasal Swab  Result Value Ref Range   SARS Coronavirus 2 by RT PCR NEGATIVE NEGATIVE    Comment: (NOTE) SARS-CoV-2 target nucleic acids are NOT DETECTED.  The SARS-CoV-2 RNA is generally detectable in upper and lower respiratory specimens during the acute phase of infection. The lowest concentration of SARS-CoV-2 viral copies this assay can detect is 250 copies / mL. A negative result does not preclude SARS-CoV-2 infection and should not be used as the sole basis for treatment or other patient management decisions.  A negative result may occur with improper specimen collection / handling, submission of specimen other than nasopharyngeal swab, presence of viral mutation(s) within the areas targeted by this assay, and inadequate number of viral copies (<250 copies / mL). A negative result must be combined with clinical observations, patient history, and epidemiological information.  Fact Sheet for Patients:   RoadLapTop.co.za  Fact Sheet for Healthcare Providers: http://kim-miller.com/  This test is not yet approved or  cleared by the [place] FDA and has been authorized for detection and/or diagnosis  of SARS-CoV-2 by FDA under an Emergency Use Authorization (EUA).  This EUA will remain in effect (meaning this test can be used) for the duration of the COVID-19 declaration under Section 564(b)(1) of the Act, 21 U.S.C. section 360bbb-3(b)(1), unless the authorization is terminated or revoked sooner.  Performed at [facility], [address] [place], [place] [address]   Urinalysis, Routine w reflex microscopic -Urine, Clean Catch     Status: Abnormal   Collection Time: 09/23/22 11:44 AM  Result Value Ref Range   Color, Urine YELLOW YELLOW   APPearance CLEAR CLEAR   Specific Gravity, Urine >=1.030 1.005 - 1.030   pH 5.5 5.0 - 8.0   Glucose, UA NEGATIVE NEGATIVE mg/dL   Hgb urine dipstick TRACE (A) NEGATIVE   Bilirubin Urine NEGATIVE NEGATIVE   Ketones, ur >=80 (A) NEGATIVE mg/dL   Protein, ur 914 (A) NEGATIVE mg/dL   Nitrite NEGATIVE NEGATIVE   Leukocytes,Ua NEGATIVE NEGATIVE    Comment: Performed at [facility], [address] [place], [place] [address]  Urinalysis, Microscopic (reflex)     Status: Abnormal   Collection Time: 09/23/22 11:44 AM  Result Value Ref Range   RBC / HPF 0-5 0 - 5 RBC/hpf   WBC, UA 0-5 0 - 5 WBC/hpf   Bacteria, UA RARE (A) NONE SEEN   Squamous Epithelial / HPF 0-5 0 - 5 /HPF    Comment: Performed at [facility], [address] [place], [place] [address]  CBC     Status: None   Collection Time: 09/23/22 12:35 PM  Result Value Ref Range   WBC 6.4 4.0 - 10.5 K/uL   RBC 4.92 4.22 - 5.81 MIL/uL   Hemoglobin 15.3 13.0 - 17.0 g/dL   HCT 86.5 78.4 - 69.6 %   MCV 89.4 80.0 - 100.0 fL   MCH 31.1 26.0 - 34.0 pg   MCHC 34.8 30.0 - 36.0 g/dL   RDW 29.5 28.4 - 13.2 %   Platelets 171 150 - 400 K/uL   nRBC 0.0 0.0 - 0.2 %    Comment: Performed at [facility], [address] [place], [place] [address]  Basic metabolic panel     Status: Abnormal   Collection Time: 09/23/22 12:35 PM  Result Value Ref Range   Sodium 133 (L) 135 - 145 mmol/L   Potassium 5.2 (H) 3.5 - 5.1 mmol/L   Chloride 94 (L) 98 - 111 mmol/L   CO2 15 (L) 22 - 32 mmol/L   Glucose, Bld 62 (L) 70 - 99 mg/dL    Comment: Glucose reference range applies only to samples taken after fasting for at least 8 hours.   BUN 14 6 - 20 mg/dL   Creatinine, Ser 2.72 0.61 - 1.24 mg/dL    Calcium 9.8 8.9 - 53.6 mg/dL   GFR, Estimated >64 >40 mL/min    Comment: (NOTE) Calculated using the CKD-EPI Creatinine Equation (2021)    Anion gap 24 (H) 5 - 15    Comment: ELECTROLYTES REPEATED TO VERIFY Performed at [facility], [address] [place], [place] [address]   POC CBG, ED     Status: Abnormal   Collection Time: 09/23/22  2:24 PM  Result Value Ref Range   Glucose-Capillary 44 (LL) 70 - 99 mg/dL    Comment: Glucose reference range applies only to samples taken after fasting for at least 8 hours.   Comment 1  Notify RN   I-Stat venous blood gas, ED     Status: Abnormal   Collection Time: 09/23/22  2:52 PM  Result Value Ref Range   pH, Ven 7.256 7.25 - 7.43   pCO2, Ven 44.2 44 - 60 mmHg   pO2, Ven <15 (LL) 32 - 45 mmHg   Bicarbonate 19.8 (L) 20.0 - 28.0 mmol/L   TCO2 21 (L) 22 - 32 mmol/L   O2 Saturation 15 %   Acid-base deficit 7.0 (H) 0.0 - 2.0 mmol/L   Sodium 130 (L) 135 - 145 mmol/L   Potassium 5.3 (H) 3.5 - 5.1 mmol/L   Calcium, Ion 1.30 1.15 - 1.40 mmol/L   HCT 49.0 39.0 - 52.0 %   Hemoglobin 16.7 13.0 - 17.0 g/dL   Patient temperature 84.6 F    Sample type VENOUS    Comment VALUES EXPECTED, NO REPEAT   CBG monitoring, ED     Status: Abnormal   Collection Time: 09/23/22  2:58 PM  Result Value Ref Range   Glucose-Capillary 210 (H) 70 - 99 mg/dL    Comment: Glucose reference range applies only to samples taken after fasting for at least 8 hours.  CK     Status: None   Collection Time: 09/23/22  3:04 PM  Result Value Ref Range   Total CK 176 49 - 397 U/L    Comment: HEMOLYSIS AT THIS LEVEL MAY AFFECT RESULT Performed at [facility], [address] [place], [place] [address]   Hepatic function panel     Status: Abnormal   Collection Time: 09/23/22  3:04 PM  Result Value Ref Range   Total Protein 8.8 (H) 6.5 - 8.1 g/dL   Albumin 5.3 (H) 3.5 - 5.0 g/dL   AST 284 (H) 15 - 41 U/L    Comment: HEMOLYSIS AT THIS LEVEL MAY AFFECT  RESULT   ALT 141 (H) 0 - 44 U/L    Comment: HEMOLYSIS AT THIS LEVEL MAY AFFECT RESULT   Alkaline Phosphatase 93 38 - 126 U/L   Total Bilirubin 1.7 (H) 0.3 - 1.2 mg/dL    Comment: HEMOLYSIS AT THIS LEVEL MAY AFFECT RESULT   Bilirubin, Direct 0.2 0.0 - 0.2 mg/dL   Indirect Bilirubin 1.5 (H) 0.3 - 0.9 mg/dL    Comment: Performed at [facility], [address] [place], [place] [address]  TSH     Status: None   Collection Time: 09/23/22  3:04 PM  Result Value Ref Range   TSH 1.272 0.350 - 4.500 uIU/mL    Comment: Performed by a 3rd Generation assay with a functional sensitivity of <=0.01 uIU/mL. Performed at [facility], [address] [place], [place] [address]   T4, free     Status: Abnormal   Collection Time: 09/23/22  3:04 PM  Result Value Ref Range   Free T4 0.54 (L) 0.61 - 1.12 ng/dL    Comment: (NOTE) Biotin ingestion may interfere with free T4 tests. If the results are inconsistent with the TSH level, previous test results, or the clinical presentation, then consider biotin interference. If needed, order repeat testing after stopping biotin. Performed at [facility], [address], [place], [place] [address]   Lactic acid, plasma     Status: None   Collection Time: 09/23/22  3:20 PM  Result Value Ref Range   Lactic Acid, Venous 1.8 0.5 - 1.9 mmol/L    Comment: Performed at [facility],  [address] [place], [place] [address]  Basic metabolic panel     Status: Abnormal   Collection Time: 09/23/22  4:39 PM  Result Value Ref Range   Sodium 133 (L) 135 - 145 mmol/L   Potassium 4.1 3.5 - 5.1 mmol/L   Chloride 100 98 - 111 mmol/L   CO2 18 (L) 22 - 32 mmol/L   Glucose, Bld 83 70 - 99 mg/dL    Comment: Glucose reference range applies only to samples taken after fasting for at least 8 hours.   BUN 12 6 - 20 mg/dL   Creatinine, Ser 3.87 0.61 - 1.24 mg/dL   Calcium 8.7 (L) 8.9 - 10.3 mg/dL   GFR, Estimated >56 >43 mL/min    Comment:  (NOTE) Calculated using the CKD-EPI Creatinine Equation (2021)    Anion gap 15 5 - 15    Comment: Performed at [facility], [address] [place], [place] [address]   US Abdomen Limited RUQ (LIVER/GB)  Result Date: 09/23/2022 CLINICAL DATA:  Elevated LFTs. EXAM: ULTRASOUND ABDOMEN LIMITED RIGHT UPPER QUADRANT COMPARISON:  07/16/2012. FINDINGS: Gallbladder: The gallbladder is contracted and a small stone is noted. No significant gallbladder wall thickening. No sonographic Murphy sign noted by sonographer. Common bile duct: Diameter: 4.7 mm Liver: No focal lesion identified. Increased parenchymal echogenicity. Portal vein is patent on color Doppler imaging with normal direction of blood flow towards the liver. Other: No free fluid. IMPRESSION: 1. Contracted gallbladder with cholelithiasis. No evidence of acute cholecystitis. 2. Hepatic steatosis. Electronically Signed   By: Thornell Sartorius M.D.   On: 09/23/2022 20:07    Pending Labs Unresulted Labs (From admission, onward)     Start     Ordered   09/23/22 1356  Blood gas, venous (at [facility] and [facility])  Once,   R        09/23/22 1356            Vitals/Pain Today's Vitals   09/23/22 1700 09/23/22 1740 09/23/22 2000 09/23/22 2100  BP: (!) 136/93  138/83 137/86  Pulse: 75  63 67  Resp: 17  18 16   Temp: 97.7 F (36.5 C)   98 F (36.7 C)  TempSrc: Oral   Oral  SpO2: 99%  99% 100%  Weight:      Height:      PainSc:  3   3     Isolation Precautions Airborne and Contact precautions  Medications Medications  ketorolac (TORADOL) 15 MG/ML injection 15 mg (15 mg Intravenous Given 09/23/22 1253)  ondansetron (ZOFRAN) injection 4 mg (4 mg Intravenous Given 09/23/22 1253)  sodium chloride 0.9 % bolus 1,000 mL (0 mLs Intravenous Stopped 09/23/22 1520)  dextrose 50 % solution 50 mL (50 mLs Intravenous Given 09/23/22 1444)  thiamine (VITAMIN B1) injection 100 mg (100 mg Intravenous Given 09/23/22 1440)  sodium chloride 0.9 % bolus 1,000 mL  (0 mLs Intravenous Stopped 09/23/22 1652)  morphine (PF) 2 MG/ML injection 2 mg (2 mg Intravenous Given 09/23/22 1717)  morphine (PF) 4 MG/ML injection 4 mg (4 mg Intravenous Given 09/23/22 2122)    Mobility walks     Focused Assessments RUQ abdominal pain   R Recommendations: See Admitting Provider Note  Report given to:   Additional Notes: None

## 2022-09-24 ENCOUNTER — Inpatient Hospital Stay (HOSPITAL_COMMUNITY): Payer: Commercial Managed Care - HMO

## 2022-09-24 ENCOUNTER — Encounter (HOSPITAL_COMMUNITY): Payer: Self-pay | Admitting: Family Medicine

## 2022-09-24 DIAGNOSIS — E8729 Other acidosis: Secondary | ICD-10-CM | POA: Diagnosis present

## 2022-09-24 DIAGNOSIS — R509 Fever, unspecified: Secondary | ICD-10-CM | POA: Diagnosis present

## 2022-09-24 DIAGNOSIS — E871 Hypo-osmolality and hyponatremia: Secondary | ICD-10-CM | POA: Diagnosis present

## 2022-09-24 DIAGNOSIS — R030 Elevated blood-pressure reading, without diagnosis of hypertension: Secondary | ICD-10-CM | POA: Diagnosis present

## 2022-09-24 DIAGNOSIS — E11649 Type 2 diabetes mellitus with hypoglycemia without coma: Secondary | ICD-10-CM | POA: Diagnosis present

## 2022-09-24 DIAGNOSIS — Z79899 Other long term (current) drug therapy: Secondary | ICD-10-CM | POA: Diagnosis not present

## 2022-09-24 DIAGNOSIS — R7401 Elevation of levels of liver transaminase levels: Secondary | ICD-10-CM | POA: Diagnosis present

## 2022-09-24 DIAGNOSIS — Z1152 Encounter for screening for COVID-19: Secondary | ICD-10-CM | POA: Diagnosis not present

## 2022-09-24 DIAGNOSIS — R1031 Right lower quadrant pain: Secondary | ICD-10-CM

## 2022-09-24 DIAGNOSIS — E1140 Type 2 diabetes mellitus with diabetic neuropathy, unspecified: Secondary | ICD-10-CM | POA: Diagnosis present

## 2022-09-24 DIAGNOSIS — Z7141 Alcohol abuse counseling and surveillance of alcoholic: Secondary | ICD-10-CM | POA: Diagnosis not present

## 2022-09-24 DIAGNOSIS — R112 Nausea with vomiting, unspecified: Secondary | ICD-10-CM | POA: Diagnosis present

## 2022-09-24 DIAGNOSIS — K8020 Calculus of gallbladder without cholecystitis without obstruction: Principal | ICD-10-CM

## 2022-09-24 DIAGNOSIS — K219 Gastro-esophageal reflux disease without esophagitis: Secondary | ICD-10-CM | POA: Diagnosis present

## 2022-09-24 DIAGNOSIS — R1011 Right upper quadrant pain: Secondary | ICD-10-CM | POA: Diagnosis not present

## 2022-09-24 DIAGNOSIS — R109 Unspecified abdominal pain: Secondary | ICD-10-CM | POA: Diagnosis present

## 2022-09-24 DIAGNOSIS — F1010 Alcohol abuse, uncomplicated: Secondary | ICD-10-CM | POA: Diagnosis present

## 2022-09-24 DIAGNOSIS — K760 Fatty (change of) liver, not elsewhere classified: Secondary | ICD-10-CM | POA: Diagnosis present

## 2022-09-24 DIAGNOSIS — F17210 Nicotine dependence, cigarettes, uncomplicated: Secondary | ICD-10-CM | POA: Diagnosis present

## 2022-09-24 DIAGNOSIS — Z880 Allergy status to penicillin: Secondary | ICD-10-CM | POA: Diagnosis not present

## 2022-09-24 DIAGNOSIS — E875 Hyperkalemia: Secondary | ICD-10-CM | POA: Diagnosis present

## 2022-09-24 LAB — COMPREHENSIVE METABOLIC PANEL
ALT: 87 U/L — ABNORMAL HIGH (ref 0–44)
AST: 114 U/L — ABNORMAL HIGH (ref 15–41)
Albumin: 3.9 g/dL (ref 3.5–5.0)
Alkaline Phosphatase: 66 U/L (ref 38–126)
Anion gap: 12 (ref 5–15)
BUN: 9 mg/dL (ref 6–20)
CO2: 23 mmol/L (ref 22–32)
Calcium: 9 mg/dL (ref 8.9–10.3)
Chloride: 98 mmol/L (ref 98–111)
Creatinine, Ser: 0.88 mg/dL (ref 0.61–1.24)
GFR, Estimated: >60 mL/min (ref >60)
Glucose, Bld: 102 mg/dL — ABNORMAL HIGH (ref 70–99)
Potassium: 3.9 mmol/L (ref 3.5–5.1)
Sodium: 133 mmol/L — ABNORMAL LOW (ref 135–145)
Total Bilirubin: 1.4 mg/dL — ABNORMAL HIGH (ref 0.3–1.2)
Total Protein: 6.5 g/dL (ref 6.5–8.1)

## 2022-09-24 LAB — HEPATITIS PANEL, ACUTE
HCV Ab: NONREACTIVE
Hep A IgM: NONREACTIVE
Hep B C IgM: NONREACTIVE
Hepatitis B Surface Ag: NONREACTIVE

## 2022-09-24 LAB — CBC
HCT: 39.2 % (ref 39.0–52.0)
Hemoglobin: 13.2 g/dL (ref 13.0–17.0)
MCH: 30.5 pg (ref 26.0–34.0)
MCHC: 33.7 g/dL (ref 30.0–36.0)
MCV: 90.5 fL (ref 80.0–100.0)
Platelets: 160 10*3/uL (ref 150–400)
RBC: 4.33 MIL/uL (ref 4.22–5.81)
RDW: 12.6 % (ref 11.5–15.5)
WBC: 3.1 10*3/uL — ABNORMAL LOW (ref 4.0–10.5)
nRBC: 0 % (ref 0.0–0.2)

## 2022-09-24 LAB — GLUCOSE, CAPILLARY
Glucose-Capillary: 95 mg/dL (ref 70–99)
Glucose-Capillary: 107 mg/dL — ABNORMAL HIGH (ref 70–99)
Glucose-Capillary: 98 mg/dL (ref 70–99)
Glucose-Capillary: 99 mg/dL (ref 70–99)
Glucose-Capillary: 122 mg/dL — ABNORMAL HIGH (ref 70–99)

## 2022-09-24 LAB — RAPID URINE DRUG SCREEN, HOSP PERFORMED
Amphetamines: NOT DETECTED
Barbiturates: NOT DETECTED
Benzodiazepines: NOT DETECTED
Cocaine: NOT DETECTED
Opiates: POSITIVE — AB
Tetrahydrocannabinol: NOT DETECTED

## 2022-09-24 LAB — ETHANOL: Alcohol, Ethyl (B): <10 mg/dL (ref <10)

## 2022-09-24 LAB — HEMOGLOBIN A1C
Hgb A1c MFr Bld: 6.4 % — ABNORMAL HIGH (ref 4.8–5.6)
Mean Plasma Glucose: 136.98 mg/dL

## 2022-09-24 LAB — HIV ANTIBODY (ROUTINE TESTING W REFLEX): HIV Screen 4th Generation wRfx: NONREACTIVE

## 2022-09-24 MED ORDER — THIAMINE HCL 100 MG/ML IJ SOLN: 100.0000 mg | Freq: Every day | INTRAMUSCULAR | Status: DC

## 2022-09-24 MED ORDER — METRONIDAZOLE 500 MG/100ML IV SOLN
500.0000 mg | Freq: Two times a day (BID) | INTRAVENOUS | Status: DC
  Administered 2022-09-24 – 2022-09-25 (×3): 500 mg via INTRAVENOUS
  Filled 2022-09-24 (×3): qty 100

## 2022-09-24 MED ORDER — ADULT MULTIVITAMIN W/MINERALS CH
1.0000 | ORAL_TABLET | Freq: Every day | ORAL | Status: DC
  Administered 2022-09-24 – 2022-09-25 (×2): 1 via ORAL
  Filled 2022-09-24 (×2): qty 1

## 2022-09-24 MED ORDER — ENOXAPARIN SODIUM 40 MG/0.4ML IJ SOSY
40.0000 mg | PREFILLED_SYRINGE | INTRAMUSCULAR | Status: DC
  Administered 2022-09-24: 40 mg via SUBCUTANEOUS
  Filled 2022-09-24: qty 0.4

## 2022-09-24 MED ORDER — ONDANSETRON HCL 4 MG PO TABS: 4.0000 mg | ORAL_TABLET | Freq: Four times a day (QID) | ORAL | 0 refills | Status: AC | PRN

## 2022-09-24 MED ORDER — ONDANSETRON HCL 4 MG/2ML IJ SOLN
4.0000 mg | Freq: Four times a day (QID) | INTRAMUSCULAR | Status: DC | PRN
  Administered 2022-09-24: 4 mg via INTRAVENOUS
  Filled 2022-09-24: qty 2

## 2022-09-24 MED ORDER — SENNOSIDES-DOCUSATE SODIUM 8.6-50 MG PO TABS: 1.0000 | ORAL_TABLET | Freq: Every evening | ORAL | Status: DC | PRN

## 2022-09-24 MED ORDER — IOHEXOL 9 MG/ML PO SOLN
500.0000 mL | ORAL | Status: AC
  Administered 2022-09-24 (×2): 500 mL via ORAL

## 2022-09-24 MED ORDER — IPRATROPIUM BROMIDE 0.02 % IN SOLN: 0.5000 mg | Freq: Four times a day (QID) | RESPIRATORY_TRACT | Status: DC | PRN

## 2022-09-24 MED ORDER — HYDROCODONE-ACETAMINOPHEN 5-325 MG PO TABS
1.0000 | ORAL_TABLET | ORAL | Status: DC | PRN
  Administered 2022-09-24 (×2): 1 via ORAL
  Filled 2022-09-24 (×2): qty 1

## 2022-09-24 MED ORDER — IOHEXOL 300 MG/ML  SOLN
100.0000 mL | Freq: Once | INTRAMUSCULAR | Status: AC | PRN
  Administered 2022-09-24: 100 mL via INTRAVENOUS

## 2022-09-24 MED ORDER — HYDRALAZINE HCL 20 MG/ML IJ SOLN: 5.0000 mg | Freq: Three times a day (TID) | INTRAMUSCULAR | Status: DC | PRN

## 2022-09-24 MED ORDER — MORPHINE SULFATE (PF) 2 MG/ML IV SOLN
1.0000 mg | Freq: Four times a day (QID) | INTRAVENOUS | Status: DC | PRN
  Administered 2022-09-24 – 2022-09-25 (×2): 1 mg via INTRAVENOUS
  Filled 2022-09-24 (×2): qty 1

## 2022-09-24 MED ORDER — IOHEXOL 9 MG/ML PO SOLN
ORAL | Status: AC
  Filled 2022-09-24: qty 1000

## 2022-09-24 MED ORDER — THIAMINE MONONITRATE 100 MG PO TABS
100.0000 mg | ORAL_TABLET | Freq: Every day | ORAL | Status: DC
  Administered 2022-09-24 – 2022-09-25 (×2): 100 mg via ORAL
  Filled 2022-09-24 (×2): qty 1

## 2022-09-24 MED ORDER — FOLIC ACID 1 MG PO TABS
1.0000 mg | ORAL_TABLET | Freq: Every day | ORAL | Status: DC
  Administered 2022-09-24 – 2022-09-25 (×2): 1 mg via ORAL
  Filled 2022-09-24 (×2): qty 1

## 2022-09-24 MED ORDER — LORAZEPAM 1 MG PO TABS: 1.0000 mg | ORAL_TABLET | ORAL | Status: DC | PRN

## 2022-09-24 MED ORDER — SODIUM CHLORIDE 0.9 % IV SOLN
2.0000 g | INTRAVENOUS | Status: DC
  Administered 2022-09-24 – 2022-09-25 (×2): 2 g via INTRAVENOUS
  Filled 2022-09-24 (×2): qty 20

## 2022-09-24 MED ORDER — INSULIN ASPART 100 UNIT/ML IJ SOLN: 0.0000 [IU] | INTRAMUSCULAR | Status: DC

## 2022-09-24 MED ORDER — ACETAMINOPHEN 325 MG PO TABS: 650.0000 mg | ORAL_TABLET | Freq: Four times a day (QID) | ORAL | Status: DC | PRN

## 2022-09-24 MED ORDER — ZOLPIDEM TARTRATE 5 MG PO TABS
5.0000 mg | ORAL_TABLET | Freq: Every evening | ORAL | Status: DC | PRN
  Administered 2022-09-24: 5 mg via ORAL
  Filled 2022-09-24: qty 1

## 2022-09-24 MED ORDER — BISACODYL 5 MG PO TBEC: 5.0000 mg | DELAYED_RELEASE_TABLET | Freq: Every day | ORAL | Status: DC | PRN

## 2022-09-24 MED ORDER — ONDANSETRON HCL 4 MG PO TABS: 4.0000 mg | ORAL_TABLET | Freq: Four times a day (QID) | ORAL | Status: DC | PRN

## 2022-09-24 MED ORDER — ACETAMINOPHEN 650 MG RE SUPP: 650.0000 mg | Freq: Four times a day (QID) | RECTAL | Status: DC | PRN

## 2022-09-24 MED ORDER — LORAZEPAM 2 MG/ML IJ SOLN: 1.0000 mg | INTRAMUSCULAR | Status: DC | PRN

## 2022-09-24 MED ORDER — ALBUTEROL SULFATE (2.5 MG/3ML) 0.083% IN NEBU: 2.5000 mg | INHALATION_SOLUTION | Freq: Four times a day (QID) | RESPIRATORY_TRACT | Status: DC | PRN

## 2022-09-24 MED ORDER — SODIUM CHLORIDE 0.9% FLUSH
3.0000 mL | Freq: Two times a day (BID) | INTRAVENOUS | Status: DC
  Administered 2022-09-24 (×2): 3 mL via INTRAVENOUS

## 2022-09-24 MED ORDER — LACTATED RINGERS IV SOLN: INTRAVENOUS | Status: DC

## 2022-09-24 MED ORDER — PANTOPRAZOLE SODIUM 40 MG PO TBEC
40.0000 mg | DELAYED_RELEASE_TABLET | Freq: Every day | ORAL | Status: DC
  Administered 2022-09-24 – 2022-09-25 (×2): 40 mg via ORAL
  Filled 2022-09-24 (×2): qty 1

## 2022-09-24 NOTE — H&P (Signed)
History and Physical   [facility] [place] [facility] Admission History and Physical [employer], D.O.    Patient Name: Luis [name] MR#: [id number] Date of Birth: 1980/10/17 Date of Admission: 09/23/2022  Referring MD/NP/PA: [facility] Primary Care Physician: Patient, No Pcp Per  Chief Complaint:  Chief Complaint  Patient presents with   Generalized Body Aches    HPI: Luis [name] is a 42 y.o. male with a known history of OA, asthma, CHF, DM, HTN, MM, SCA presents to the emergency department for evaluation of abdominal pain.  Patient was in a usual state of health until yesterday when he developed body aches, fever, congestion along with right upper quadrant and midepigastric abdominal pain.  He states that he drinks 4-8 beers daily last drink three days ago. Bowel movements have been mucous.   Patient denies fevers/chills, weakness, dizziness, chest pain, shortness of breath, N/V/C/D, dysuria/frequency, changes in mental status.    Otherwise there has been no change in status. Patient has been taking medication as prescribed and there has been no recent change in medication or diet.  No recent antibiotics.  There has been no recent illness, hospitalizations, travel or sick contacts.    EMS/ED Course: Patient received Toradol, morphine, Zofran, NS. Medical admission has been requested for further management of RUQ pain, transaminitis.  Review of Systems:  CONSTITUTIONAL: Positive fever/chills, negative fatigue, weakness, weight gain/loss, headache. EYES: No blurry or double vision. ENT: No tinnitus, postnasal drip, redness or soreness of the oropharynx. RESPIRATORY: No cough, dyspnea, wheeze.  No hemoptysis.  CARDIOVASCULAR: No chest pain, palpitations, syncope, orthopnea. No lower extremity edema.  GASTROINTESTINAL: Positive abdominal pain.  No nausea, vomiting,  diarrhea, constipation.  No hematemesis, melena or hematochezia. GENITOURINARY: No dysuria, frequency,  hematuria. ENDOCRINE: No polyuria or nocturia. No heat or cold intolerance. HEMATOLOGY: No anemia, bruising, bleeding. INTEGUMENTARY: No rashes, ulcers, lesions. MUSCULOSKELETAL: No arthritis, gout. NEUROLOGIC: No numbness, tingling, ataxia, seizure-type activity, weakness. PSYCHIATRIC: No anxiety, depression, insomnia.   History reviewed. No pertinent past medical history.  Past Surgical History:  Procedure Laterality Date   HAND SURGERY Right    MANDIBLE SURGERY Right      reports that he has been smoking cigarettes. He has a 7.5 pack-year smoking history. He has never used smokeless tobacco. He reports that he does not currently use alcohol. He reports that he does not currently use drugs after having used the following drugs: Marijuana.  Allergies  Allergen Reactions   Penicillins     History reviewed. No pertinent family history.  Prior to Admission medications   Medication Sig Start Date End Date Taking? Authorizing Provider  clindamycin (CLEOCIN) 300 MG capsule Take 1 capsule (300 mg total) by mouth 4 (four) times daily. 08/08/16   Linwood Dibbles, MD  HYDROcodone-acetaminophen (NORCO/VICODIN) 5-325 MG per tablet Take 1 tablet by mouth every 4 (four) hours as needed. 04/26/14   Ward, Layla Maw, DO  ibuprofen (ADVIL,MOTRIN) 800 MG tablet Take 1 tablet (800 mg total) by mouth every 8 (eight) hours as needed for mild pain. 04/26/14   Ward, Layla Maw, DO  naproxen (NAPROSYN) 500 MG tablet Take 1 tablet (500 mg total) by mouth 2 (two) times daily. 08/08/16   Linwood Dibbles, MD  omeprazole (PRILOSEC) 20 MG capsule TK ONE C PO QD 07/12/17   [provider]  traMADol (ULTRAM) 50 MG tablet Take 1 tablet (50 mg total) by mouth every 6 (six) hours as needed. 02/18/14   Rolan Bucco, MD  Physical Exam: Vitals:   09/23/22 2100 09/24/22 0100 09/24/22 0150 09/24/22 0200  BP: 137/86 127/88 (!) 144/83   Pulse: 67 61 60   Resp: 16 12 17    Temp: 98 F (36.7 C) 98.1 F (36.7 C) 98.2 F  (36.8 C)   TempSrc: Oral Oral    SpO2: 100% 100% 100%   Weight:    72.2 kg  Height:    6\' 1"  (1.854 m)    GENERAL: 42 y.o.-year-old male patient, well-developed, well-nourished lying in the bed in no acute distress.  Pleasant and cooperative.   HEENT: Head atraumatic, normocephalic. Pupils equal. Mucus membranes moist. NECK: Supple. No JVD. CHEST: Normal breath sounds bilaterally. No wheezing, rales, rhonchi or crackles. No use of accessory muscles of respiration.  No reproducible chest wall tenderness.  CARDIOVASCULAR: S1, S2 normal. No murmurs, rubs, or gallops. Cap refill <2 seconds. Pulses intact distally.  ABDOMEN: Soft, nondistended, RUQ and midepigastric TTP. No rebound, guarding, rigidity. Normoactive bowel sounds present in all four quadrants.  EXTREMITIES: No pedal edema, cyanosis, or clubbing. No calf tenderness or Homan's sign.  NEUROLOGIC: The patient is alert and oriented x 3. Cranial nerves II through XII are grossly intact with no focal sensorimotor deficit. PSYCHIATRIC:  Normal affect, mood, thought content. SKIN: Warm, dry, and intact without obvious rash, lesion, or ulcer.    Labs on Admission:  CBC: Recent Labs  Lab 09/23/22 1235 09/23/22 1452  WBC 6.4  --   HGB 15.3 16.7  HCT 44.0 49.0  MCV 89.4  --   PLT 171  --    Basic Metabolic Panel: Recent Labs  Lab 09/23/22 1235 09/23/22 1452 09/23/22 1639  NA 133* 130* 133*  K 5.2* 5.3* 4.1  CL 94*  --  100  CO2 15*  --  18*  GLUCOSE 62*  --  83  BUN 14  --  12  CREATININE 1.16  --  0.87  CALCIUM 9.8  --  8.7*   GFR: Estimated Creatinine Clearance: 110.7 mL/min (by C-G formula based on SCr of 0.87 mg/dL). Liver Function Tests: Recent Labs  Lab 09/23/22 1504  AST 237*  ALT 141*  ALKPHOS 93  BILITOT 1.7*  PROT 8.8*  ALBUMIN 5.3*   No results for input(s): "LIPASE", "AMYLASE" in the last 168 hours. No results for input(s): "AMMONIA" in the last 168 hours. Coagulation Profile: No results for  input(s): "INR", "PROTIME" in the last 168 hours. Cardiac Enzymes: Recent Labs  Lab 09/23/22 1504  CKTOTAL 176   BNP (last 3 results) No results for input(s): "PROBNP" in the last 8760 hours. HbA1C: No results for input(s): "HGBA1C" in the last 72 hours. CBG: Recent Labs  Lab 09/23/22 1424 09/23/22 1458  GLUCAP 44* 210*   Lipid Profile: No results for input(s): "CHOL", "HDL", "LDLCALC", "TRIG", "CHOLHDL", "LDLDIRECT" in the last 72 hours. Thyroid Function Tests: Recent Labs    09/23/22 1504  TSH 1.272  FREET4 0.54*   Anemia Panel: No results for input(s): "VITAMINB12", "FOLATE", "FERRITIN", "TIBC", "IRON", "RETICCTPCT" in the last 72 hours. Urine analysis:    Component Value Date/Time   COLORURINE YELLOW 09/23/2022 1144   APPEARANCEUR CLEAR 09/23/2022 1144   LABSPEC >=1.030 09/23/2022 1144   PHURINE 5.5 09/23/2022 1144   GLUCOSEU NEGATIVE 09/23/2022 1144   HGBUR TRACE (A) 09/23/2022 1144   BILIRUBINUR NEGATIVE 09/23/2022 1144   KETONESUR >=80 (A) 09/23/2022 1144   PROTEINUR 100 (A) 09/23/2022 1144   UROBILINOGEN 1.0 04/26/2014 1110   NITRITE NEGATIVE 09/23/2022  1144   LEUKOCYTESUR NEGATIVE 09/23/2022 1144   Sepsis Labs: @LABRCNTIP (procalcitonin:4,lacticidven:4) ) Recent Results (from the past 240 hour(s))  SARS Coronavirus 2 by RT PCR (hospital order, performed in [facility] hospital lab) *cepheid single result test* Anterior Nasal Swab     Status: None   Collection Time: 09/23/22 10:23 AM   Specimen: Anterior Nasal Swab  Result Value Ref Range Status   SARS Coronavirus 2 by RT PCR NEGATIVE NEGATIVE Final    Comment: (NOTE) SARS-CoV-2 target nucleic acids are NOT DETECTED.  The SARS-CoV-2 RNA is generally detectable in upper and lower respiratory specimens during the acute phase of infection. The lowest concentration of SARS-CoV-2 viral copies this assay can detect is 250 copies / mL. A negative result does not preclude SARS-CoV-2 infection and should not  be used as the sole basis for treatment or other patient management decisions.  A negative result may occur with improper specimen collection / handling, submission of specimen other than nasopharyngeal swab, presence of viral mutation(s) within the areas targeted by this assay, and inadequate number of viral copies (<250 copies / mL). A negative result must be combined with clinical observations, patient history, and epidemiological information.  Fact Sheet for Patients:   RoadLapTop.co.za  Fact Sheet for Healthcare Providers: http://kim-miller.com/  This test is not yet approved or  cleared by the [place] FDA and has been authorized for detection and/or diagnosis of SARS-CoV-2 by FDA under an Emergency Use Authorization (EUA).  This EUA will remain in effect (meaning this test can be used) for the duration of the COVID-19 declaration under Section 564(b)(1) of the Act, 21 U.S.C. section 360bbb-3(b)(1), unless the authorization is terminated or revoked sooner.  Performed at [facility], [address] [place], [place] [address]      Radiological Exams on Admission: US Abdomen Limited RUQ (LIVER/GB)  Result Date: 09/23/2022 CLINICAL DATA:  Elevated LFTs. EXAM: ULTRASOUND ABDOMEN LIMITED RIGHT UPPER QUADRANT COMPARISON:  07/16/2012. FINDINGS: Gallbladder: The gallbladder is contracted and a small stone is noted. No significant gallbladder wall thickening. No sonographic Murphy sign noted by sonographer. Common bile duct: Diameter: 4.7 mm Liver: No focal lesion identified. Increased parenchymal echogenicity. Portal vein is patent on color Doppler imaging with normal direction of blood flow towards the liver. Other: No free fluid. IMPRESSION: 1. Contracted gallbladder with cholelithiasis. No evidence of acute cholecystitis. 2. Hepatic steatosis. Electronically Signed   By: Thornell Sartorius M.D.   On: 09/23/2022 20:07      Assessment/Plan  This is a 42 y.o. male with a history of OA, asthma, CHF, DM, HTN, MM, SCA now being admitted with:  #. Fever, abdominal pain RUQ, elevated LFTs - [place] negative for acute choly, ? Biliary colic vs. pancreatitis - Admit IP - IV Zosyn - Pain control and antiemetics - General surgery to consult, consider GI as well for possible ERCP - Check hepatitis panel  #. H/o GERD - Protonix  #. H/o Diabetes - Accuchecks q4h with RISS coverage  #. H/O ETOH - CIWA - Ethanol level, UDS    Admission status: IP IV Fluids: LR Diet/Nutrition: NPO Consults called: Surgery  DVT Px: Lovenox, SCDs and early ambulation. Code Status: Full Code  Disposition Plan: To home in 1-2 days  All the records are reviewed and case discussed with ED provider. Management plans discussed with the patient and/or family who express understanding and agree with plan of care.  Rodarius Kichline D.O. on 09/24/2022 at 3:12 AM CC: Primary care  physician; Patient, No Pcp Per   09/24/2022, 3:12 AM

## 2022-09-24 NOTE — TOC Initial Note (Signed)
Transition of Care [facility]) - Initial/Assessment Note    Patient Details  Name: Luis [name] MRN: [id number] Date of Birth: 1980-08-11  Transition of Care [facility]) CM/SW Contact:    Otelia Santee, LCSW Phone Number: 09/24/2022, 11:01 AM  Clinical Narrative:                 Met with pt to discuss current alcohol use and interest for resources. Pt shares he typically drinks out of boredom and is confident  that he is able to cut back on alcohol use on his own. He denies alcohol use causing issues in his daily life. He is agreeable to resources being placed on discharge paperwork. Resources have been added to AVS.   Expected Discharge Plan: Home/Self Care Barriers to Discharge: No Barriers Identified   Patient Goals and CMS Choice Patient states their goals for this hospitalization and ongoing recovery are:: To return home CMS Medicare.gov Compare Post Acute Care list provided to:: Patient Choice offered to / list presented to : Patient [place] ownership interest in [facility].provided to:: Patient    Expected Discharge Plan and Services In-house Referral: Clinical Social Work Discharge Planning Services: NA Post Acute Care Choice: NA Living arrangements for the past 2 months: Apartment                 DME Arranged: N/A DME Agency: NA                  Prior Living Arrangements/Services Living arrangements for the past 2 months: Apartment Lives with:: Self Patient language and need for interpreter reviewed:: Yes Do you feel safe going back to the place where you live?: Yes      Need for Family Participation in Patient Care: No (Comment) Care giver support system in place?: No (comment) Current home services: Other (comment) (NA) Criminal Activity/Legal Involvement Pertinent to Current Situation/Hospitalization: No - Comment as needed  Activities of Daily Living Home Assistive Devices/Equipment: None ADL Screening (condition at time of admission) Patient's  cognitive ability adequate to safely complete daily activities?: Yes Is the patient deaf or have difficulty hearing?: Yes Does the patient have difficulty seeing, even when wearing glasses/contacts?: Yes Does the patient have difficulty concentrating, remembering, or making decisions?: Yes Patient able to express need for assistance with ADLs?: Yes Does the patient have difficulty dressing or bathing?: Yes Independently performs ADLs?: Yes (appropriate for developmental age) Does the patient have difficulty walking or climbing stairs?: Yes Weakness of Legs: None Weakness of Arms/Hands: None  Permission Sought/Granted   Permission granted to share information with : No              Emotional Assessment Appearance:: Appears stated age Attitude/Demeanor/Rapport: Engaged Affect (typically observed): Pleasant, Accepting Orientation: : Oriented to Self, Oriented to Place, Oriented to  Time, Oriented to Situation Alcohol / Substance Use: Alcohol Use Psych Involvement: No (comment)  Admission diagnosis:  Alcoholic ketoacidosis [E87.29] Transaminitis [R74.01] Calculus of gallbladder without cholecystitis without obstruction [K80.20] Decreased oral intake [R63.8] Abdominal pain [R10.9] Patient Active Problem List   Diagnosis Date Noted   Calculus of gallbladder without cholecystitis without obstruction 09/24/2022   Alcoholic ketoacidosis 09/24/2022   Transaminitis 09/24/2022   Abdominal pain 09/23/2022   PCP:  Patient, No Pcp Per Pharmacy:   [id number] - [place], [place] - [address] [place] [address] Phone: [phone] Fax: [phone]  [id number] - [place],  [place] - [address] [place] [address] Phone: [phone] Fax: [phone]     Social Determinants of Health (SDOH) Social History: SDOH Screenings   Food Insecurity: No Food Insecurity  (09/24/2022)  Housing: Low Risk  (09/24/2022)  Transportation Needs: No Transportation Needs (09/24/2022)  Utilities: Not At Risk (09/24/2022)  Financial Resource Strain: Low Risk  (05/07/2022)   Received from [facility]  Social Connections: Unknown (03/20/2022)   Received from [facility]  Tobacco Use: High Risk (09/24/2022)   SDOH Interventions:     Readmission Risk Interventions    09/24/2022   10:57 AM  Readmission Risk Prevention Plan  Post Dischage Appt Complete  Medication Screening Complete  Transportation Screening Complete

## 2022-09-24 NOTE — Progress Notes (Signed)
   Patient seen and examined at bedside, patient admitted after midnight, please see earlier detailed admission note by [facility], DO. Briefly, patient presented secondary to generalized body aches and nausea.  Subjective: Patient reports some RLQ abdominal pain. Most of his issues are in his bilateral lower back with pain radiation down the back of his legs.  BP (!) 150/92 (BP Location: Right Arm)   Pulse (!) 54   Temp 98.2 F (36.8 C) (Oral)   Resp 20   Ht 6\' 1"  (1.854 m)   Wt 72.2 kg   SpO2 100%   BMI 20.99 kg/m   General exam: Appears calm and comfortable Respiratory system: Clear to auscultation. Respiratory effort normal. Cardiovascular system: S1 & S2 heard, RRR. No murmurs, rubs, gallops or clicks. Gastrointestinal system: Abdomen is nondistended, soft and mildly tender in RLQ. Normal bowel sounds heard. Central nervous system: Alert and oriented. No focal neurological deficits. Musculoskeletal: No edema. No calf tenderness. Tenderness over bilateral SI joints Skin: No cyanosis. No rashes Psychiatry: Judgement and insight appear normal. Mood & affect appropriate.   Brief assessment/Plan:  Fever Unclear etiology. Associated abdominal pain without etiology for possible infection. No blood cultures obtained on admission. Empiric ceftriaxone and Flagyl started on admission. -Discontinue antibiotics -Trend fever curve  Abdominal pain Unclear etiology. Workup, including RUQ ultrasound and CT abdomen/pelvis, negative for acute process. No red flag symptoms.  Nausea/vomiting Patient with continued nausea this evening and was unable to keep dinner down. -Will continue IV fluids -Continue Zofran  Family communication: None at bedside DVT prophylaxis: Lovenox Disposition: Discharge home in AM if improved  Jacquelin Hawking, MD [facility] 09/24/2022, 6:35 PM

## 2022-09-24 NOTE — Progress Notes (Signed)
Sister Nakomis stated that the patient have history of complication from anesthesia. Pt requests to inform sister before any type of surgery or procedure that requires anesthesia or sedation.

## 2022-09-24 NOTE — Plan of Care (Signed)
  Problem: Clinical Measurements: Goal: Diagnostic test results will improve Outcome: Progressing Goal: Respiratory complications will improve Outcome: Progressing   Problem: Activity: Goal: Risk for activity intolerance will decrease Outcome: Progressing   Problem: Nutrition: Goal: Adequate nutrition will be maintained Outcome: Progressing   

## 2022-09-24 NOTE — Plan of Care (Signed)

## 2022-09-24 NOTE — Discharge Instructions (Signed)
Luis [name],  You were in the hospital because of abdominal pain and nausea with concern for your gallbladder. Thankfully, your gallbladder looks okay. Please follow-up with your PCP.

## 2022-09-24 NOTE — Consult Note (Signed)
Luis [name] 1980-08-03  [id number].    Requesting MD: Dr. Vivi Barrack Chief Complaint/Reason for Consult: gallstones  HPI:  This is a 42 yo black male with a history fo ETOH abuse ( at least 6 16oz beers/day, heavier on WE), chronically elevated LFTs secondary to above, neuropathy secondary to above, and tobacco abuse who states he wasn't feeling well on Sunday with no specific complaints, just malaise.  On Monday he developed some back pain and right sided abdominal pain.  He admits to anorexia and some nausea, but no emesis.  This is not related to oral intake.  He admits to some reflux.  He tried Tums with no relief.  He used to take medication for reflux, but has not had to take this in a while.  He denies diarrhea.  Last BM was mucous-like yesterday.  Denies any blood in his stool.  He denies dysuria or hematoma.  He has never had symptoms like these.  He has never had a colonoscopy or endoscopy.    The patient's symptoms persisted and so he presented to the ED for evaluation.  He was noted to have a normal WBC at 3, but some elevated LFTs (AST/ALT) which have been chronically elevated for years looking through his chart.  His TB was also 1.7, but his indirect bilirubin was 1.5 and direct only 0.2.  He has an [place] that reveals a gallstone, but a contracted gb with no wall thickening or pericholecystic fluid.  No evidence for cholecystitis.  He subjectively admits to feeling like he had a fever on Monday, but did not take his temperature and has not had a fever here.  He has a UA with some trace hgb, ketones, and proteinuria. He was admitted for further workup.  Denies a history of hepatitis.  Hep panel is pending.  We have been asked to see him for evaluation for his gallstone.   ROS: ROS: see HPI  History reviewed. No pertinent family history.  Past Medical History:  Diagnosis Date   Elevated LFTs    chronic, likely secondary to ETOH   ETOH abuse    Neuropathy    patient states he  has been told this is from ETOH abuse    Past Surgical History:  Procedure Laterality Date   HAND SURGERY Right    MANDIBLE SURGERY Right     Social History:  reports that he has been smoking cigarettes. He has a 7.5 pack-year smoking history. He has never used smokeless tobacco. He reports current alcohol use of about 42.0 standard drinks of alcohol per week. He reports that he does not currently use drugs after having used the following drugs: Marijuana.  Allergies:  Allergies  Allergen Reactions   Penicillins     Medications Prior to Admission  Medication Sig Dispense Refill   gabapentin (NEURONTIN) 600 MG tablet Take 600 mg by mouth 3 (three) times daily.     ibuprofen (ADVIL,MOTRIN) 800 MG tablet Take 1 tablet (800 mg total) by mouth every 8 (eight) hours as needed for mild pain. 30 tablet 0   nortriptyline (PAMELOR) 10 MG capsule Take 10 mg by mouth at bedtime.     clindamycin (CLEOCIN) 300 MG capsule Take 1 capsule (300 mg total) by mouth 4 (four) times daily. (Patient not taking: Reported on 09/24/2022) 28 capsule 0   HYDROcodone-acetaminophen (NORCO/VICODIN) 5-325 MG per tablet Take 1 tablet by mouth every 4 (four) hours as needed. (Patient not taking: Reported on 09/24/2022) 15 tablet  0   naproxen (NAPROSYN) 500 MG tablet Take 1 tablet (500 mg total) by mouth 2 (two) times daily. (Patient not taking: Reported on 09/24/2022) 30 tablet 0   omeprazole (PRILOSEC) 20 MG capsule Take 20 mg by mouth daily. (Patient not taking: Reported on 09/24/2022)  0   traMADol (ULTRAM) 50 MG tablet Take 1 tablet (50 mg total) by mouth every 6 (six) hours as needed. (Patient not taking: Reported on 09/24/2022) 15 tablet 0     Physical Exam: Blood pressure 134/87, pulse (!) 59, temperature 98.1 F (36.7 C), resp. rate 17, height 6\' 1"  (1.854 m), weight 72.2 kg, SpO2 100%. General: pleasant, WD, WN black male who is laying in bed in NAD HEENT: head is normocephalic, atraumatic.  Sclera are  noninjected.  PERRL.  Ears and nose without any masses or lesions.  Mouth is pink and dry Heart: regular, rate, and rhythm.  Normal s1,s2. No obvious murmurs, gallops, or rubs noted.  Lungs: CTAB, no wheezes, rhonchi, or rales noted.  Respiratory effort nonlabored Abd: soft, minimally tender on right-side in RUQ and RLQ, greatest in RLQ, but he has no guarding and doesn't wince with palpation, ND, +BS, no masses, hernias, or organomegaly.  No CVA tenderness bilaterally but pain a bit lower on the ride side to mild percussion. Psych: A&Ox3 with an appropriate affect.   Results for orders placed or performed during the hospital encounter of 09/23/22 (from the past 48 hour(s))  SARS Coronavirus 2 by RT PCR (hospital order, performed in [facility] hospital lab) *cepheid single result test* Anterior Nasal Swab     Status: None   Collection Time: 09/23/22 10:23 AM   Specimen: Anterior Nasal Swab  Result Value Ref Range   SARS Coronavirus 2 by RT PCR NEGATIVE NEGATIVE    Comment: (NOTE) SARS-CoV-2 target nucleic acids are NOT DETECTED.  The SARS-CoV-2 RNA is generally detectable in upper and lower respiratory specimens during the acute phase of infection. The lowest concentration of SARS-CoV-2 viral copies this assay can detect is 250 copies / mL. A negative result does not preclude SARS-CoV-2 infection and should not be used as the sole basis for treatment or other patient management decisions.  A negative result may occur with improper specimen collection / handling, submission of specimen other than nasopharyngeal swab, presence of viral mutation(s) within the areas targeted by this assay, and inadequate number of viral copies (<250 copies / mL). A negative result must be combined with clinical observations, patient history, and epidemiological information.  Fact Sheet for Patients:   RoadLapTop.co.za  Fact Sheet for Healthcare  Providers: http://kim-miller.com/  This test is not yet approved or  cleared by the [place] FDA and has been authorized for detection and/or diagnosis of SARS-CoV-2 by FDA under an Emergency Use Authorization (EUA).  This EUA will remain in effect (meaning this test can be used) for the duration of the COVID-19 declaration under Section 564(b)(1) of the Act, 21 U.S.C. section 360bbb-3(b)(1), unless the authorization is terminated or revoked sooner.  Performed at [facility], [address] [place], [place] [address]   Urinalysis, Routine w reflex microscopic -Urine, Clean Catch     Status: Abnormal   Collection Time: 09/23/22 11:44 AM  Result Value Ref Range   Color, Urine YELLOW YELLOW   APPearance CLEAR CLEAR   Specific Gravity, Urine >=1.030 1.005 - 1.030   pH 5.5 5.0 - 8.0   Glucose, UA NEGATIVE NEGATIVE mg/dL   Hgb urine dipstick  TRACE (A) NEGATIVE   Bilirubin Urine NEGATIVE NEGATIVE   Ketones, ur >=80 (A) NEGATIVE mg/dL   Protein, ur 829 (A) NEGATIVE mg/dL   Nitrite NEGATIVE NEGATIVE   Leukocytes,Ua NEGATIVE NEGATIVE    Comment: Performed at [facility], [address] [place], [place] [address]  Urinalysis, Microscopic (reflex)     Status: Abnormal   Collection Time: 09/23/22 11:44 AM  Result Value Ref Range   RBC / HPF 0-5 0 - 5 RBC/hpf   WBC, UA 0-5 0 - 5 WBC/hpf   Bacteria, UA RARE (A) NONE SEEN   Squamous Epithelial / HPF 0-5 0 - 5 /HPF    Comment: Performed at [facility], [address] [place], [place] [address]  CBC     Status: None   Collection Time: 09/23/22 12:35 PM  Result Value Ref Range   WBC 6.4 4.0 - 10.5 K/uL   RBC 4.92 4.22 - 5.81 MIL/uL   Hemoglobin 15.3 13.0 - 17.0 g/dL   HCT 84.6 96.2 - 95.2 %   MCV 89.4 80.0 - 100.0 fL   MCH 31.1 26.0 - 34.0 pg   MCHC 34.8 30.0 - 36.0 g/dL   RDW 84.1 32.4 - 40.1 %   Platelets 171 150 - 400 K/uL   nRBC 0.0 0.0 - 0.2 %    Comment: Performed at  [facility], [address] [place], [place] [address]  Basic metabolic panel     Status: Abnormal   Collection Time: 09/23/22 12:35 PM  Result Value Ref Range   Sodium 133 (L) 135 - 145 mmol/L   Potassium 5.2 (H) 3.5 - 5.1 mmol/L   Chloride 94 (L) 98 - 111 mmol/L   CO2 15 (L) 22 - 32 mmol/L   Glucose, Bld 62 (L) 70 - 99 mg/dL    Comment: Glucose reference range applies only to samples taken after fasting for at least 8 hours.   BUN 14 6 - 20 mg/dL   Creatinine, Ser 3.66 0.61 - 1.24 mg/dL   Calcium 9.8 8.9 - 44.0 mg/dL   GFR, Estimated >34 >74 mL/min    Comment: (NOTE) Calculated using the CKD-EPI Creatinine Equation (2021)    Anion gap 24 (H) 5 - 15    Comment: ELECTROLYTES REPEATED TO VERIFY Performed at [facility], [address] [place], [place] [address]   POC CBG, ED     Status: Abnormal   Collection Time: 09/23/22  2:24 PM  Result Value Ref Range   Glucose-Capillary 44 (LL) 70 - 99 mg/dL    Comment: Glucose reference range applies only to samples taken after fasting for at least 8 hours.   Comment 1 Notify RN   I-Stat venous blood gas, ED     Status: Abnormal   Collection Time: 09/23/22  2:52 PM  Result Value Ref Range   pH, Ven 7.256 7.25 - 7.43   pCO2, Ven 44.2 44 - 60 mmHg   pO2, Ven <15 (LL) 32 - 45 mmHg   Bicarbonate 19.8 (L) 20.0 - 28.0 mmol/L   TCO2 21 (L) 22 - 32 mmol/L   O2 Saturation 15 %   Acid-base deficit 7.0 (H) 0.0 - 2.0 mmol/L   Sodium 130 (L) 135 - 145 mmol/L   Potassium 5.3 (H) 3.5 - 5.1 mmol/L   Calcium, Ion 1.30 1.15 - 1.40 mmol/L   HCT 49.0 39.0 - 52.0 %   Hemoglobin 16.7 13.0 - 17.0  g/dL   Patient temperature 13.2 F    Sample type VENOUS    Comment VALUES EXPECTED, NO REPEAT   CBG monitoring, ED     Status: Abnormal   Collection Time: 09/23/22  2:58 PM  Result Value Ref Range   Glucose-Capillary 210 (H) 70 - 99 mg/dL    Comment: Glucose reference range applies only to samples taken after fasting for at least 8  hours.  CK     Status: None   Collection Time: 09/23/22  3:04 PM  Result Value Ref Range   Total CK 176 49 - 397 U/L    Comment: HEMOLYSIS AT THIS LEVEL MAY AFFECT RESULT Performed at [facility], [address] [place], [place] [address]   Hepatic function panel     Status: Abnormal   Collection Time: 09/23/22  3:04 PM  Result Value Ref Range   Total Protein 8.8 (H) 6.5 - 8.1 g/dL   Albumin 5.3 (H) 3.5 - 5.0 g/dL   AST 272 (H) 15 - 41 U/L    Comment: HEMOLYSIS AT THIS LEVEL MAY AFFECT RESULT   ALT 141 (H) 0 - 44 U/L    Comment: HEMOLYSIS AT THIS LEVEL MAY AFFECT RESULT   Alkaline Phosphatase 93 38 - 126 U/L   Total Bilirubin 1.7 (H) 0.3 - 1.2 mg/dL    Comment: HEMOLYSIS AT THIS LEVEL MAY AFFECT RESULT   Bilirubin, Direct 0.2 0.0 - 0.2 mg/dL   Indirect Bilirubin 1.5 (H) 0.3 - 0.9 mg/dL    Comment: Performed at [facility], [address] [place], [place] [address]  TSH     Status: None   Collection Time: 09/23/22  3:04 PM  Result Value Ref Range   TSH 1.272 0.350 - 4.500 uIU/mL    Comment: Performed by a 3rd Generation assay with a functional sensitivity of <=0.01 uIU/mL. Performed at [facility], [address], [place], [place] [address]   T4, free     Status: Abnormal   Collection Time: 09/23/22  3:04 PM  Result Value Ref Range   Free T4 0.54 (L) 0.61 - 1.12 ng/dL    Comment: (NOTE) Biotin ingestion may interfere with free T4 tests. If the results are inconsistent with the TSH level, previous test results, or the clinical presentation, then consider biotin interference. If needed, order repeat testing after stopping biotin. Performed at [facility], [address] [place], [place] [address]   Lactic acid, plasma     Status: None   Collection Time: 09/23/22  3:20 PM  Result Value Ref Range   Lactic Acid, Venous 1.8 0.5 - 1.9 mmol/L    Comment: Performed at [facility], [address] [place], [place] [address]   Basic metabolic panel     Status: Abnormal   Collection Time: 09/23/22  4:39 PM  Result Value Ref Range   Sodium 133 (L) 135 - 145 mmol/L   Potassium 4.1 3.5 - 5.1 mmol/L   Chloride 100 98 - 111 mmol/L   CO2 18 (L) 22 - 32 mmol/L   Glucose, Bld 83 70 - 99 mg/dL    Comment: Glucose reference range applies only to samples taken after fasting for at least 8 hours.   BUN 12 6 - 20 mg/dL   Creatinine, Ser 6.43 0.61 - 1.24 mg/dL   Calcium 8.7 (L) 8.9 - 10.3 mg/dL   GFR, Estimated >32 >95 mL/min    Comment: (  NOTE) Calculated using the CKD-EPI Creatinine Equation (2021)    Anion gap 15 5 - 15    Comment: Performed at [facility], [address] [place], [place] [address]  Glucose, capillary     Status: None   Collection Time: 09/24/22  3:53 AM  Result Value Ref Range   Glucose-Capillary 95 70 - 99 mg/dL    Comment: Glucose reference range applies only to samples taken after fasting for at least 8 hours.  Ethanol     Status: None   Collection Time: 09/24/22  5:08 AM  Result Value Ref Range   Alcohol, Ethyl (B) <10 <10 mg/dL    Comment: (NOTE) Lowest detectable limit for serum alcohol is 10 mg/dL.  For medical purposes only. Performed at [facility], [address] [place], [place] [address]   Comprehensive metabolic panel     Status: Abnormal   Collection Time: 09/24/22  5:08 AM  Result Value Ref Range   Sodium 133 (L) 135 - 145 mmol/L   Potassium 3.9 3.5 - 5.1 mmol/L   Chloride 98 98 - 111 mmol/L   CO2 23 22 - 32 mmol/L   Glucose, Bld 102 (H) 70 - 99 mg/dL    Comment: Glucose reference range applies only to samples taken after fasting for at least 8 hours.   BUN 9 6 - 20 mg/dL   Creatinine, Ser 4.74 0.61 - 1.24 mg/dL   Calcium 9.0 8.9 - 25.9 mg/dL   Total Protein 6.5 6.5 - 8.1 g/dL   Albumin 3.9 3.5 - 5.0 g/dL   AST 563 (H) 15 - 41 U/L   ALT 87 (H) 0 - 44 U/L   Alkaline Phosphatase 66 38 - 126 U/L   Total Bilirubin 1.4 (H) 0.3 - 1.2 mg/dL    GFR, Estimated >87 >56 mL/min    Comment: (NOTE) Calculated using the CKD-EPI Creatinine Equation (2021)    Anion gap 12 5 - 15    Comment: Performed at [facility], [address], [place], [place] [address]  CBC     Status: Abnormal   Collection Time: 09/24/22  5:08 AM  Result Value Ref Range   WBC 3.1 (L) 4.0 - 10.5 K/uL   RBC 4.33 4.22 - 5.81 MIL/uL   Hemoglobin 13.2 13.0 - 17.0 g/dL   HCT 51.8 84.1 - 66.0 %   MCV 90.5 80.0 - 100.0 fL   MCH 30.5 26.0 - 34.0 pg   MCHC 33.7 30.0 - 36.0 g/dL   RDW 63.0 16.0 - 10.9 %   Platelets 160 150 - 400 K/uL   nRBC 0.0 0.0 - 0.2 %    Comment: Performed at [facility], [address] [place], [place] [address]  Hemoglobin A1c     Status: Abnormal   Collection Time: 09/24/22  5:08 AM  Result Value Ref Range   Hgb A1c MFr Bld 6.4 (H) 4.8 - 5.6 %    Comment: (NOTE) Pre diabetes:          5.7%-6.4%  Diabetes:              >6.4%  Glycemic control for   <7.0% adults with diabetes    Mean Plasma Glucose 136.98 mg/dL    Comment: Performed at [facility], [address] [place], [place] [address]  Rapid urine drug screen (hospital performed)     Status: Abnormal   Collection Time: 09/24/22  5:40 AM  Result  Value Ref Range   Opiates POSITIVE (A) NONE DETECTED   Cocaine NONE DETECTED NONE DETECTED   Benzodiazepines NONE DETECTED NONE DETECTED   Amphetamines NONE DETECTED NONE DETECTED   Tetrahydrocannabinol NONE DETECTED NONE DETECTED   Barbiturates NONE DETECTED NONE DETECTED    Comment: (NOTE) DRUG SCREEN FOR MEDICAL PURPOSES ONLY.  IF CONFIRMATION IS NEEDED FOR ANY PURPOSE, NOTIFY LAB WITHIN 5 DAYS.  LOWEST DETECTABLE LIMITS FOR URINE DRUG SCREEN Drug Class                     Cutoff (ng/mL) Amphetamine and metabolites    1000 Barbiturate and metabolites    200 Benzodiazepine                 200 Opiates and metabolites        300 Cocaine and metabolites        300 THC                             50 Performed at [facility], [address] [place], [place] [address]   Glucose, capillary     Status: Abnormal   Collection Time: 09/24/22  8:09 AM  Result Value Ref Range   Glucose-Capillary 107 (H) 70 - 99 mg/dL    Comment: Glucose reference range applies only to samples taken after fasting for at least 8 hours.   US Abdomen Limited RUQ (LIVER/GB)  Result Date: 09/23/2022 CLINICAL DATA:  Elevated LFTs. EXAM: ULTRASOUND ABDOMEN LIMITED RIGHT UPPER QUADRANT COMPARISON:  07/16/2012. FINDINGS: Gallbladder: The gallbladder is contracted and a small stone is noted. No significant gallbladder wall thickening. No sonographic Murphy sign noted by sonographer. Common bile duct: Diameter: 4.7 mm Liver: No focal lesion identified. Increased parenchymal echogenicity. Portal vein is patent on color Doppler imaging with normal direction of blood flow towards the liver. Other: No free fluid. IMPRESSION: 1. Contracted gallbladder with cholelithiasis. No evidence of acute cholecystitis. 2. Hepatic steatosis. Electronically Signed   By: Thornell Sartorius M.D.   On: 09/23/2022 20:07      Assessment/Plan Cholelithiasis, abdominal pain The patient has been seen, examined, chart, labs, vitals, and imaging personally reviewed.  The patient's presenting symptoms are not totally consistent with biliary disease.  He has some pain on the right side of his abdomen, but he actually complains more of RLQ pain than RUQ pain.  He states he has some pain in his pain and then points to it radiating to the RLQ.  He denies urinary symptoms but does have trace hgb, ketones, and proteins.  He could benefit from a noncon CT scan to rule out nephrolithiasis.  His LFTs are elevated some, but this is a chronic problem in review of his chart.  He was told this elevation is secondary to his ETOH abuse.  He has never had an endoscopy or colonoscopy.  He could have some reflux or ulcer disease given his ETOH  abuse as well as his tobacco abuse, but he got no relief with reflux meds.  I would also expect a different history and more pain in his epigastrium.  At this time, we have no plans for surgical intervention for his gallbladder as it is not clear that this is the etiology of his symptoms.  He does not appear to have evidence of cholecystitis.  He could have some biliary colic.  However, the duration of his symptoms is a bit prolonged and  location is not super consistent.  He also has no symptoms related to enteral intake.  Hepatitis panel is pending as well.  I have discussed this with the primary team.  We will be available as needed.   FEN - NPO, but may have a diet from our standpoint VTE - Lovenox ID - Rocephin, does not need from a gb standpoint  ETOH abuse  Chronically elevated LFTs Peripheral neuropathy - patient states secondary to ETOH abuse Hyperglycemia - hgbA1C is 6.4, per medicine Tobacco abuse  I reviewed ED provider notes, hospitalist notes, last 24 h vitals and pain scores, last 48 h intake and output, last 24 h labs and trends, and last 24 h imaging results.  Letha Cape, [facility] 09/24/2022, 9:20 AM Please see Amion for pager number during day hours 7:00am-4:30pm or 7:00am -11:30am on weekends

## 2022-09-24 NOTE — Progress Notes (Signed)
Hospitalist Transfer Note:    Nursing staff, Please call [facility] Admits & Consults System-Wide number on Amion [phone]) as soon as patient's arrival, so appropriate admitting provider can evaluate the pt.   Transferring facility: [facility] Requesting provider: Dr. Jearld Fenton (EDP at [facility]) Reason for transfer: admission for further evaluation and management of RUQ abd pain.    42 year old male  who presented to [facility] ED complaining of 1 to 2 days of right upper quadrant abdominal discomfort associated with generalized myalgias and subjective fever.   Vital signs in the ED were notable for the following: Afebrile; systolic blood pressures in the 120s to 140s.  Labs were notable for elevated liver enzymes relative to liver enzymes drawn 3 months ago, including interval elevation in total bilirubin, which is noted to be 1.7.  CBC notable for will with cell count of 6400.  Imaging notable for right upper quadrant ultrasound which showed a contracted gallbladder with cholelithiasis, without responding evidence of acute cholecystitis nor any evidence of dilation of the common bile duct and no radiographic evidence of choledocholithiasis  EDP discussed case/imaging with on-call general surgery (on-call at [facility]), Dr. Magnus Ivan, who recommended [facility] admission, and conveyed that general surgery will formally consult and see the patient.   Subsequently, I accepted this patient for transfer for inpatient admission to a med-tele bed at [facility] for further work-up and management of the above.      Newton Pigg, DO Hospitalist

## 2022-09-25 DIAGNOSIS — R1031 Right lower quadrant pain: Secondary | ICD-10-CM | POA: Diagnosis not present

## 2022-09-25 LAB — GLUCOSE, CAPILLARY
Glucose-Capillary: 100 mg/dL — ABNORMAL HIGH (ref 70–99)
Glucose-Capillary: 114 mg/dL — ABNORMAL HIGH (ref 70–99)
Glucose-Capillary: 115 mg/dL — ABNORMAL HIGH (ref 70–99)

## 2022-09-25 NOTE — Plan of Care (Signed)

## 2022-09-25 NOTE — Hospital Course (Addendum)
Luis [name] is a 42 y.o. male with a history of tobacco use, alcohol abuse, macrocytic anemia, sciatica.  Patient presented secondary to development of abdominal pain with associated body aches and fever.  Complicated by alcohol use.  Initial evaluation was concerning for possible biliary source.  Ultrasound imaging was consistent with cholelithiasis without evidence of cholecystitis.  General surgery was consulted with recommendation for no inpatient surgical management and unlikely etiology for symptoms to be related to biliary source.  Patient improved with supportive care.  Patient with some nausea and vomiting which is also managed with supportive care and has resolved.  While admitted, hemoglobin A1c was noted to be 6.4%, consistent with prediabetes.  Patient will need to follow-up with his primary care physician for ongoing management of prediabetes.  Alcohol cessation discussed with patient.

## 2022-09-25 NOTE — Discharge Summary (Signed)
Physician Discharge Summary   Patient: Luis [name] MRN: [id number] DOB: 1980-09-28  Admit date:     09/23/2022  Discharge date: 09/25/22  Discharge Physician: Jacquelin Hawking, MD   PCP: Susann Givens, PA-C   Recommendations at discharge:  Hospital follow-up with PCP Carb modified diet Can consider metformin for prediabetes management Continued alcohol cessation discussions  Discharge Diagnoses: Principal Problem:   Abdominal pain Active Problems:   Calculus of gallbladder without cholecystitis without obstruction   Alcoholic ketoacidosis   Transaminitis  Resolved Problems:   * No resolved hospital problems. *  Hospital Course: Luis [name] is a 42 y.o. male with a history of tobacco use, alcohol abuse, macrocytic anemia, sciatica.  Patient presented secondary to development of abdominal pain with associated body aches and fever.  Complicated by alcohol use.  Initial evaluation was concerning for possible biliary source.  Ultrasound imaging was consistent with cholelithiasis without evidence of cholecystitis.  General surgery was consulted with recommendation for no inpatient surgical management and unlikely etiology for symptoms to be related to biliary source.  Patient improved with supportive care.  Patient with some nausea and vomiting which is also managed with supportive care and has resolved.  While admitted, hemoglobin A1c was noted to be 6.4%, consistent with prediabetes.  Patient will need to follow-up with his primary care physician for ongoing management of prediabetes.  Alcohol cessation discussed with patient.  Assessment and Plan:  Fever Unclear etiology. Associated abdominal pain without etiology for possible infection. No blood cultures obtained on admission. Empiric ceftriaxone and Flagyl started on admission but were quickly discontinued.  Patient with no recurrent fevers while inpatient.  Abdominal pain Unclear etiology. Workup, including RUQ ultrasound and CT  abdomen/pelvis, negative for acute process. No red flag symptoms.  Symptoms improved prior to discharge.  Elevated AST/ALT Elevated bilirubin Likely related to alcohol intake.  Improved prior to discharge.   Nausea/vomiting Patient with continued nausea this evening and was unable to keep dinner down. -Will continue IV fluids -Continue Zofran  Elevated blood pressure Patient without a history of hypertension.  Last primary care office visit significant for normotension.  Recommend continued monitoring for hopeful improvement in blood pressure.  Will need antihypertensives if remains hypertensive as an outpatient.   Consultants: General Surgery Procedures performed: None Disposition: Home Diet recommendation: Carb modified diet   DISCHARGE MEDICATION: Allergies as of 09/25/2022       Reactions   Penicillins         Medication List     STOP taking these medications    clindamycin 300 MG capsule Commonly known as: CLEOCIN   HYDROcodone-acetaminophen 5-325 MG tablet Commonly known as: NORCO/VICODIN   ibuprofen 800 MG tablet Commonly known as: ADVIL   naproxen 500 MG tablet Commonly known as: NAPROSYN   traMADol 50 MG tablet Commonly known as: ULTRAM       TAKE these medications    gabapentin 600 MG tablet Commonly known as: NEURONTIN Take 600 mg by mouth 3 (three) times daily.   nortriptyline 10 MG capsule Commonly known as: PAMELOR Take 10 mg by mouth at bedtime.   omeprazole 20 MG capsule Commonly known as: PRILOSEC Take 20 mg by mouth daily.   ondansetron 4 MG tablet Commonly known as: ZOFRAN Take 1 tablet (4 mg total) by mouth every 6 (six) hours as needed for nausea.        Discharge Exam: BP (!) 142/98 (BP Location: Right Arm)   Pulse (!) 56   Temp 98 F (  36.7 C) (Oral)   Resp 17   Ht 6\' 1"  (1.854 m)   Wt 72.2 kg   SpO2 100%   BMI 20.99 kg/m   General exam: Appears calm and comfortable Respiratory system: Clear to auscultation.  Respiratory effort normal. Cardiovascular system: S1 & S2 heard, RRR. No murmurs, rubs, gallops or clicks. Gastrointestinal system: Abdomen is nondistended, soft and nontender. No organomegaly or masses felt. Normal bowel sounds heard. Central nervous system: Alert and oriented. No focal neurological deficits. Musculoskeletal: No edema. No calf tenderness Skin: No cyanosis. No rashes Psychiatry: Judgement and insight appear normal. Mood & affect appropriate.   Condition at discharge: stable  The results of significant diagnostics from this hospitalization (including imaging, microbiology, ancillary and laboratory) are listed below for reference.   Imaging Studies: CT ABDOMEN PELVIS W CONTRAST  Result Date: 09/24/2022 CLINICAL DATA:  Abdominal pain EXAM: CT ABDOMEN AND PELVIS WITH CONTRAST TECHNIQUE: Multidetector CT imaging of the abdomen and pelvis was performed using the standard protocol following bolus administration of intravenous contrast. RADIATION DOSE REDUCTION: This exam was performed according to the departmental dose-optimization program which includes automated exposure control, adjustment of the mA and/or kV according to patient size and/or use of iterative reconstruction technique. CONTRAST:  OMNIPAQUE IOHEXOL 300 MG/ML  SOLN COMPARISON:  07/16/2012 FINDINGS: Lower chest: Lung bases are clear. Hepatobiliary: Small hepatic cysts measuring up to 12 mm (series 2/image 18). Tiny layering gallstone in the gallbladder fundus (series 2/image 39), without associated inflammatory changes. No intrahepatic or extrahepatic dilatation. Pancreas: Within normal limits. Spleen: Within normal limits Adrenals/Urinary Tract: Adrenal glands are within normal limits. 6 mm cyst in the right upper kidney (series 2/image 23), too small to characterize but likely reflecting a benign simple cyst. No follow-up is recommended. Left kidney is within normal limits. No hydronephrosis. Bladder is mildly  thick-walled. Stomach/Bowel: Stomach is within normal limits. No evidence of bowel obstruction. Normal appendix (series 2/image 33). No colonic wall thickening or inflammatory changes. Vascular/Lymphatic: No evidence of abdominal aortic aneurysm. Atherosclerotic calcifications of the abdominal aorta and branch vessels, although vessels remain patent. No suspicious abdominopelvic lymphadenopathy. Reproductive: Prostate is unremarkable, noting dystrophic calcifications. Other: Trace right pelvic ascites. Musculoskeletal: Visualized osseous structures are within normal limits. IMPRESSION: Mildly thick-walled bladder, nonspecific.  Correlate for cystitis. Cholelithiasis, without associated inflammatory changes. Additional ancillary findings as above. Electronically Signed   By: Charline Bills M.D.   On: 09/24/2022 17:55   US Abdomen Limited RUQ (LIVER/GB)  Result Date: 09/23/2022 CLINICAL DATA:  Elevated LFTs. EXAM: ULTRASOUND ABDOMEN LIMITED RIGHT UPPER QUADRANT COMPARISON:  07/16/2012. FINDINGS: Gallbladder: The gallbladder is contracted and a small stone is noted. No significant gallbladder wall thickening. No sonographic Murphy sign noted by sonographer. Common bile duct: Diameter: 4.7 mm Liver: No focal lesion identified. Increased parenchymal echogenicity. Portal vein is patent on color Doppler imaging with normal direction of blood flow towards the liver. Other: No free fluid. IMPRESSION: 1. Contracted gallbladder with cholelithiasis. No evidence of acute cholecystitis. 2. Hepatic steatosis. Electronically Signed   By: Thornell Sartorius M.D.   On: 09/23/2022 20:07    Microbiology: Results for orders placed or performed during the hospital encounter of 09/23/22  SARS Coronavirus 2 by RT PCR (hospital order, performed in [facility] hospital lab) *cepheid single result test* Anterior Nasal Swab     Status: None   Collection Time: 09/23/22 10:23 AM   Specimen: Anterior Nasal Swab  Result Value Ref Range  Status   SARS Coronavirus 2 by  RT PCR NEGATIVE NEGATIVE Final    Comment: (NOTE) SARS-CoV-2 target nucleic acids are NOT DETECTED.  The SARS-CoV-2 RNA is generally detectable in upper and lower respiratory specimens during the acute phase of infection. The lowest concentration of SARS-CoV-2 viral copies this assay can detect is 250 copies / mL. A negative result does not preclude SARS-CoV-2 infection and should not be used as the sole basis for treatment or other patient management decisions.  A negative result may occur with improper specimen collection / handling, submission of specimen other than nasopharyngeal swab, presence of viral mutation(s) within the areas targeted by this assay, and inadequate number of viral copies (<250 copies / mL). A negative result must be combined with clinical observations, patient history, and epidemiological information.  Fact Sheet for Patients:   RoadLapTop.co.za  Fact Sheet for Healthcare Providers: http://kim-miller.com/  This test is not yet approved or  cleared by the [place] FDA and has been authorized for detection and/or diagnosis of SARS-CoV-2 by FDA under an Emergency Use Authorization (EUA).  This EUA will remain in effect (meaning this test can be used) for the duration of the COVID-19 declaration under Section 564(b)(1) of the Act, 21 U.S.C. section 360bbb-3(b)(1), unless the authorization is terminated or revoked sooner.  Performed at [facility], [address] [place], [place] [address]     Labs: CBC: Recent Labs  Lab 09/23/22 1235 09/23/22 1452 09/24/22 0508  WBC 6.4  --  3.1*  HGB 15.3 16.7 13.2  HCT 44.0 49.0 39.2  MCV 89.4  --  90.5  PLT 171  --  160   Basic Metabolic Panel: Recent Labs  Lab 09/23/22 1235 09/23/22 1452 09/23/22 1639 09/24/22 0508  NA 133* 130* 133* 133*  K 5.2* 5.3* 4.1 3.9  CL 94*  --  100 98  CO2 15*  --  18* 23   GLUCOSE 62*  --  83 102*  BUN 14  --  12 9  CREATININE 1.16  --  0.87 0.88  CALCIUM 9.8  --  8.7* 9.0   Liver Function Tests: Recent Labs  Lab 09/23/22 1504 09/24/22 0508  AST 237* 114*  ALT 141* 87*  ALKPHOS 93 66  BILITOT 1.7* 1.4*  PROT 8.8* 6.5  ALBUMIN 5.3* 3.9   CBG: Recent Labs  Lab 09/24/22 1643 09/24/22 2117 09/25/22 0019 09/25/22 0327 09/25/22 0800  GLUCAP 99 122* 115* 114* 100*    Discharge time spent: 35 minutes.  Signed: Jacquelin Hawking, MD [facility] 09/25/2022
